# Patient Record
Sex: Female | Born: 1980 | Race: White | Hispanic: No | Marital: Married
Health system: Midwestern US, Community
[De-identification: ages and names within clinical notes are randomized; demographics above are authoritative.]

## PROBLEM LIST (undated history)

## (undated) DIAGNOSIS — E049 Nontoxic goiter, unspecified: Secondary | ICD-10-CM

## (undated) DIAGNOSIS — E041 Nontoxic single thyroid nodule: Secondary | ICD-10-CM

## (undated) DIAGNOSIS — M79662 Pain in left lower leg: Secondary | ICD-10-CM

## (undated) DIAGNOSIS — G43009 Migraine without aura, not intractable, without status migrainosus: Secondary | ICD-10-CM

## (undated) DIAGNOSIS — R519 Headache, unspecified: Secondary | ICD-10-CM

## (undated) DIAGNOSIS — R51 Headache: Secondary | ICD-10-CM

## (undated) DIAGNOSIS — E039 Hypothyroidism, unspecified: Secondary | ICD-10-CM

## (undated) HISTORY — PX: OVARIAN CYST REMOVAL: SHX89

## (undated) HISTORY — PX: MOUTH SURGERY: SHX715

---

## 2013-07-20 MED ORDER — VERAPAMIL 80 MG TAB
80 mg | ORAL_TABLET | Freq: Three times a day (TID) | ORAL | Status: DC
Start: 2013-07-20 — End: 2014-08-15

## 2013-07-20 MED ORDER — METHYLPREDNISOLONE 4 MG TABS IN A DOSE PACK
4 mg | PACK | ORAL | Status: DC
Start: 2013-07-20 — End: 2014-08-15

## 2013-07-20 NOTE — Progress Notes (Signed)
Neurology Consult      Subjective:      Whitney CoteCatherine H Johnston is a 33 y.o. 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 So she is had established headache since high school that sound like migraines. They would occur randomly and simply treat with over-the-counter remedies. Then about two years ago had an IUD inserted with intensification of headaches and occasional left eye discomfort times two months nocturnally. She had the IUD removed and they eventually stopped. This occurred for about two months. Now, in the last two months. In addition, to her baseline headaches. She has the appearance of these intense nocturnal headaches that awaken her in sleep. They are stereotypically in and around the left eye with associated tearing and she paces the floor and it can last anywhere from 30 min. To three hours. A hot shower on the face seems to help and does notice some nasal stuffiness on the L side. The left eye may appear injected and it does tear and the husband has commented on the eyelid appearing to be drooped. There is no nausea or vomiting. She also has a some generalized headaches these days with nausea and vomiting and dizziness that is enhanced by light, noise, smell and activity. Currently gets these nighttime headaches twice per week times two months. Has noticed that when she imbibes wine she experiences a hot facial feeling and may expect a nocturnal headache.          Current Outpatient Prescriptions   Medication Sig Dispense Refill   ??? ibuprofen (MOTRIN) 600 mg tablet Take  by mouth every six (6) hours as needed for Pain.       ??? ibuprofen (ADVIL MIGRAINE) 200 mg cap Take  by mouth.       ??? metFORMIN (GLUCOPHAGE) 500 mg tablet Take  by mouth two (2) times daily (with meals).       ??? verapamil (CALAN) 80 mg tablet Take 1 Tab by mouth three (3) times daily.  90 Tab  6   ??? methylPREDNISolone (MEDROL DOSEPACK) 4 mg tablet Per dose pack instructions  1 Package  1      No Known Allergies  Past  Medical History   Diagnosis Date   ??? Headache    ??? Elevated testosterone level in female    ??? Musculoskeletal disorder    ??? Anxiety       History reviewed. No pertinent past surgical history.   History     Social History   ??? Marital Status: N/A     Spouse Name: N/A     Number of Children: N/A   ??? Years of Education: N/A     Occupational History   ??? Not on file.     Social History Main Topics   ??? Smoking status: Former Smoker   ??? Smokeless tobacco: Never Used   ??? Alcohol Use: 2.0 oz/week     4 Glasses of wine per week   ??? Drug Use: No   ??? Sexual Activity: Yes     Other Topics Concern   ??? Not on file     Social History Narrative   ??? No narrative on file      Family History   Problem Relation Age of Onset   ??? Cancer Maternal Grandmother    ??? Stroke Maternal Grandmother    ??? Headache Paternal Grandmother    ??? Migraines Paternal Grandmother       BP 124/66  Pulse 62    Temp(Src) 98.1 ??F (36.7 ??C) (Oral)    Resp 20    Ht 5\' 6"  (1.676 m)    Wt 79.198 kg (174 lb 9.6 oz)    BMI 28.19 kg/m2      SpO2 98%    LMP 07/13/2013        Review of Systems:   A comprehensive review of systems was negative except for that written in the HPI.      Neuro Exam:     Appearance:  The patient is well developed, well nourished, provides a coherent history and is in no acute distress.   Mental Status: Oriented to time, place and person. Mood and affect appropriate.   Cranial Nerves:   Intact visual fields. Fundi are benign. PERLA, EOM's full, no nystagmus, no ptosis. Facial sensation is normal. Corneal reflexes are intact. Facial movement is symmetric. Hearing is normal bilaterally. Palate is midline with normal sternocleidomastoid and trapezius muscles are normal. Tongue is midline.   Motor:  5/5 strength in upper and lower proximal and distal muscles. Normal bulk and tone. No fasciculations.   Reflexes:   Deep tendon reflexes 2+/4 and symmetrical.   Sensory:   Normal to touch, pinprick and vibration.   Gait:  Normal gait.   Tremor:   No  tremor noted.   Cerebellar:  No cerebellar signs present.   Neurovascular:  Normal heart sounds and regular rhythm, peripheral pulses intact, and no carotid bruits.            Assessment:   Baseline migraine headaches by history and new type of L peri-orbital intense nocturnal pains. Would like to investigate new headache with imaging including brain MRI with without contrast and MRA. Would like to intervene with a Medrol dose pack and sumavel injectable as needed as well as verapamil 80 mg TID as a preventative. Was cautioned about alcohol as a trigger and could try melatonin at night before she sleeps. Will see her back shortly and see how it goes.      Plan:   revisit within two months.  Signed by :  Milana Na, MD 07/20/2013

## 2013-07-20 NOTE — Patient Instructions (Addendum)
If we have ordered testing for you, we do not call patients with results and we do not give test results over the phone.  We schedule follow up appointments so that your results can be discussed in person and any questions you have regarding them may be addressed.  If something of concern is revealed on your test, we will call you for a sooner follow up appointment.  Additionally, results may be found by using the My Chart feature and one of our patient service representatives at the front desk can give you instructions on how to access this feature of our electronic medical record system.    If you have physican order for a test or a medication denied by your insurance company, this does not mean the test or medication is not appropriate for you as that is a medical decision, not a decision to be made by an insurance company representative or by an [facility] physician who has not interviewed and examined you. This is a decision to be made between you and your physician.     The denial of services is a Education administrator between you and your insurance company, not an issue between your physician and the insurance company. If your test or medication is denied, you can take the following steps to help resolve the issue:    1.  File a complaint with the [employer] regarding your insurance company's denial of services ordered for you.  You can do this either by calling them directly or by completing an on-line complaint form on the [employer].  This can be found at [email or web address]    2.   Also file a formal complaint with your insurance company and ask to have the name of the person denying the service so that you may explore a legal option should you be harmed by this denial of service.  Again, the fact the insurance company will not pay for the service does not mean it is not medically necessary and I would encourage you to follow through with the plan that was made  with your physician    3.   File a written complaint with your employer so your employer and benefit manager is aware of the poor coverage they are providing their employees.  If you have medicare/medicaid, complain to your representative in the House and to your [place].                [facility]   Statement to Patients  July 20, 2012      In an effort to ensure the large volume of patient prescription refills is processed in the most efficient and expeditious manner, we are asking our patients to assist [place] by calling your Pharmacy for all prescription refills, this will include also your  Mail Order Pharmacy. The pharmacy will contact our office electronically to continue the refill process.    Please do not wait until the last minute to call your pharmacy. We need at least 48 hours (2days) to fill prescriptions. We also encourage you to call your pharmacy before going to pick up your prescription to make sure it is ready.     With regard to controlled substance prescription refill requests (narcotic refills) that need to be picked up at our office, we ask your cooperation by providing [place] with at least 72 hours (3days) notice that you will need a refill.  We will not refill narcotic prescription refill requests after 4:00pm on any weekday, Monday through Thursday, or after 2:00pm on Fridays, or on the weekends.      We encourage everyone to explore another way of getting your prescription refill request processed using MyChart, our patient web portal through our electronic medical record system. MyChart is an efficient and effective way to communicate your medication request directly to the office and  downloadable as an app on your smart phone . MyChart also features a review functionality that allows you to view your medication list as well as leave messages for your physician. Are you ready to get connected? If so please review the attatched instructions or  speak to any of our staff to get you set up right away!    Thank you so much for your cooperation. Should you have any questions please contact our Research officer, political party.    The Physicians and Staff,  [facility]                 Headache: After Your Visit  Your Care Instructions     Headaches have many possible causes. Most headaches aren't a sign of a more serious problem, and they will get better on their own. Home treatment may help you feel better faster.  The doctor has checked you carefully, but problems can develop later. If you notice any problems or new symptoms, get medical treatment right away.  Follow-up care is a key part of your treatment and safety. Be sure to make and go to all appointments, and call your doctor if you are having problems. It's also a good idea to know your test results and keep a list of the medicines you take.  How can you care for yourself at home?  ?? Do not drive if you have taken a prescription pain medicine.  ?? Rest in a quiet, dark room until your headache is gone. Close your eyes and try to relax or go to sleep. Don't watch TV or read.  ?? Put a cold, moist cloth or cold pack on the painful area for 10 to 20 minutes at a time. Put a thin cloth between the cold pack and your skin.  ?? Use a warm, moist towel or a heating pad set on low to relax tight shoulder and neck muscles.  ?? Have someone gently massage your neck and shoulders.  ?? Take pain medicines exactly as directed.  ?? If the doctor gave you a prescription medicine for pain, take it as prescribed.  ?? If you are not taking a prescription pain medicine, ask your doctor if you can take an over-the-counter medicine.  ?? Be careful not to take pain medicine more often than the instructions allow, because you may get worse or more frequent headaches when the medicine wears off.  ?? Do not ignore new symptoms that occur with a headache, such as a fever, weakness or numbness, vision changes, or confusion. These  may be signs of a more serious problem.  To prevent headaches  ?? Keep a headache diary so you can figure out what triggers your headaches. Avoiding triggers may help you prevent headaches. Record when each headache began, how long it lasted, and what the pain was like (throbbing, aching, stabbing, or dull). Write down any other symptoms you had with the headache, such as nausea, flashing lights or dark spots, or sensitivity to bright light or loud noise. Note if the headache occurred near your period.  List anything that might have triggered the headache, such as certain foods (chocolate, cheese, wine) or odors, smoke, bright light, stress, or lack of sleep.  ?? Find healthy ways to deal with stress. Headaches are most common during or right after stressful times. Take time to relax before and after you do something that has caused a headache in the past.  ?? Try to keep your muscles relaxed by keeping good posture. Check your jaw, face, neck, and shoulder muscles for tension, and try relaxing them. When sitting at a desk, change positions often, and stretch for 30 seconds each hour.  ?? Get plenty of sleep and exercise.  ?? Eat regularly and well. Long periods without food can trigger a headache.  ?? Treat yourself to a massage. Some people find that regular massages are very helpful in relieving tension.  ?? Limit caffeine by not drinking too much coffee, tea, or soda. But don't quit caffeine suddenly, because that can also give you headaches.  ?? Reduce eyestrain from computers by blinking frequently and looking away from the computer screen every so often. Make sure you have proper eyewear and that your monitor is set up properly, about an arm's length away.  ?? Seek help if you have depression or anxiety. Your headaches may be linked to these conditions. Treatment can both prevent headaches and help with symptoms of anxiety or depression.  When should you call for help?  Call 911 anytime you think you may need emergency  care. For example, call if:  ?? You have signs of a stroke. These may include:  ?? Sudden numbness, paralysis, or weakness in your face, arm, or leg, especially on only one side of your body.  ?? Sudden vision changes.  ?? Sudden trouble speaking.  ?? Sudden confusion or trouble understanding simple statements.  ?? Sudden problems with walking or balance.  ?? A sudden, severe headache that is different from past headaches.  Call your doctor now or seek immediate medical care if:  ?? You have a new or worse headache.  ?? Your headache gets much worse.   Where can you learn more?   Go to [email or web address].[email or web address].net/[email or web address]  Enter M271 in the search box to learn more about "Headache: After Your Visit."   ?? 2006-2015 Healthwise, Incorporated. Care instructions adapted under license by [employer] (which disclaims liability or warranty for this information). This care instruction is for use with your licensed healthcare professional. If you have questions about a medical condition or this instruction, always ask your healthcare professional. Healthwise, Incorporated disclaims any warranty or liability for your use of this information.  Content Version: 10.4.390249; Current as of: June 30, 2012  This patient has an interesting establish migraine history and recently a new intense left  Periorbital pain at night. Would like to investigate with dedicated brain MRI and MRA. Would like to rescue with a Medrol dose pack and use sumavel injectable and verapamil as a preventative. Can use melatonin at night before she sleeps to see if this helps. Would watch alcohol consumption as it may be intimately related to headache trigger.

## 2013-07-21 ENCOUNTER — Encounter

## 2013-07-21 MED ORDER — SUMAVEL DOSEPRO 6 MG/0.5 ML SUBCUTANEOUS NEEDLE-FREE INJECTOR
6 mg/0.5 mL | INJECTION | SUBCUTANEOUS | Status: DC
Start: 2013-07-21 — End: 2014-08-15

## 2013-07-21 NOTE — Telephone Encounter (Signed)
Noted, jjhmd

## 2013-07-21 NOTE — Telephone Encounter (Signed)
Pt calling stating Sumavel dospro was not sent to pharmacy listed. Please call pt when done.

## 2013-07-22 NOTE — Telephone Encounter (Signed)
Patient says she still has not gotten her sumavil. Please call.

## 2013-07-25 NOTE — Telephone Encounter (Signed)
Pt calling stating Sumavil dosepro was called into pharmacy but will call about $2,000 and would like to know if she can be called when office has samples. Please call to advise.

## 2013-11-23 ENCOUNTER — Encounter

## 2013-12-13 ENCOUNTER — Encounter

## 2014-06-15 ENCOUNTER — Inpatient Hospital Stay: Admit: 2014-06-15 | Payer: BLUE CROSS/BLUE SHIELD | Attending: Surgery | Primary: Internal Medicine

## 2014-06-15 DIAGNOSIS — E042 Nontoxic multinodular goiter: Secondary | ICD-10-CM

## 2014-08-15 ENCOUNTER — Inpatient Hospital Stay
Admit: 2014-08-15 | Discharge: 2014-08-15 | Disposition: A | Discharge status: Home / Self Care | Payer: BLUE CROSS/BLUE SHIELD | Attending: Emergency Medicine

## 2014-08-15 DIAGNOSIS — R1030 Lower abdominal pain, unspecified: Secondary | ICD-10-CM

## 2014-08-15 LAB — METABOLIC PANEL, COMPREHENSIVE
A-G Ratio: 1.1 (ref 1.1–2.2)
ALT (SGPT): 24 U/L (ref 12–78)
AST (SGOT): 12 U/L — ABNORMAL LOW (ref 15–37)
Albumin: 3.8 g/dL (ref 3.5–5.0)
Alk. phosphatase: 50 U/L (ref 45–117)
Anion gap: 6 mmol/L (ref 5–15)
BUN/Creatinine ratio: 11 — ABNORMAL LOW (ref 12–20)
BUN: 9 MG/DL (ref 6–20)
Bilirubin, total: 0.3 MG/DL (ref 0.2–1.0)
CO2: 29 mmol/L (ref 21–32)
Calcium: 8.7 MG/DL (ref 8.5–10.1)
Chloride: 106 mmol/L (ref 97–108)
Creatinine: 0.83 MG/DL (ref 0.55–1.02)
GFR est AA: >60 mL/min/{1.73_m2} (ref >60)
GFR est non-AA: >60 mL/min/{1.73_m2} (ref >60)
Globulin: 3.4 g/dL (ref 2.0–4.0)
Glucose: 69 mg/dL (ref 65–100)
Potassium: 3.7 mmol/L (ref 3.5–5.1)
Protein, total: 7.2 g/dL (ref 6.4–8.2)
Sodium: 141 mmol/L (ref 136–145)

## 2014-08-15 LAB — URINALYSIS W/ RFLX MICROSCOPIC
Bilirubin: NEGATIVE
Blood: NEGATIVE
Glucose: NEGATIVE mg/dL
Ketone: NEGATIVE mg/dL
Leukocyte Esterase: NEGATIVE
Nitrites: NEGATIVE
Protein: NEGATIVE mg/dL
Specific gravity: 1.008 (ref 1.003–1.030)
Urobilinogen: 0.2 EU/dL (ref 0.2–1.0)
pH (UA): 6 (ref 5.0–8.0)

## 2014-08-15 LAB — CBC WITH AUTOMATED DIFF
ABS. BASOPHILS: 0 10*3/uL (ref 0.0–0.1)
ABS. EOSINOPHILS: 0.1 10*3/uL (ref 0.0–0.4)
ABS. LYMPHOCYTES: 2.6 10*3/uL (ref 0.8–3.5)
ABS. MONOCYTES: 0.6 10*3/uL (ref 0.0–1.0)
ABS. NEUTROPHILS: 3.9 10*3/uL (ref 1.8–8.0)
BASOPHILS: 0 % (ref 0–1)
EOSINOPHILS: 2 % (ref 0–7)
HCT: 41.3 % (ref 35.0–47.0)
HGB: 14.4 g/dL (ref 11.5–16.0)
LYMPHOCYTES: 36 % (ref 12–49)
MCH: 32.9 PG (ref 26.0–34.0)
MCHC: 34.9 g/dL (ref 30.0–36.5)
MCV: 94.3 FL (ref 80.0–99.0)
MONOCYTES: 8 % (ref 5–13)
NEUTROPHILS: 54 % (ref 32–75)
PLATELET: 225 10*3/uL (ref 150–400)
RBC: 4.38 M/uL (ref 3.80–5.20)
RDW: 11.9 % (ref 11.5–14.5)
WBC: 7.2 10*3/uL (ref 3.6–11.0)

## 2014-08-15 LAB — HCG URINE, QL. - POC: Pregnancy test,urine (POC): NEGATIVE

## 2014-08-15 MED ORDER — IOPAMIDOL 76 % IV SOLN
370 mg iodine /mL (76 %) | Freq: Once | INTRAVENOUS | Status: AC
Start: 2014-08-15 — End: 2014-08-15
  Administered 2014-08-15: 14:00:00 via INTRAVENOUS

## 2014-08-15 MED ORDER — MORPHINE 4 MG/ML SYRINGE
4 mg/mL | Freq: Once | INTRAMUSCULAR | Status: AC
Start: 2014-08-15 — End: 2014-08-15
  Administered 2014-08-15: 13:00:00 via INTRAVENOUS

## 2014-08-15 MED ORDER — SODIUM CHLORIDE 0.9% BOLUS IV
0.9 % | Freq: Once | INTRAVENOUS | Status: DC
Start: 2014-08-15 — End: 2014-08-15

## 2014-08-15 MED ORDER — SODIUM CHLORIDE 0.9 % IJ SYRG
Freq: Once | INTRAMUSCULAR | Status: AC
Start: 2014-08-15 — End: 2014-08-15
  Administered 2014-08-15: 14:00:00 via INTRAVENOUS

## 2014-08-15 MED ORDER — ONDANSETRON (PF) 4 MG/2 ML INJECTION
4 mg/2 mL | INTRAMUSCULAR | Status: AC
Start: 2014-08-15 — End: 2014-08-15
  Administered 2014-08-15: 13:00:00 via INTRAVENOUS

## 2014-08-15 MED FILL — ISOVUE-370  76 % INTRAVENOUS SOLUTION: 370 mg iodine /mL (76 %) | INTRAVENOUS | Qty: 100

## 2014-08-15 MED FILL — BD POSIFLUSH NORMAL SALINE 0.9 % INJECTION SYRINGE: INTRAMUSCULAR | Qty: 10

## 2014-08-15 MED FILL — SODIUM CHLORIDE 0.9 % IV: INTRAVENOUS | Qty: 100

## 2014-08-15 MED FILL — MORPHINE 4 MG/ML SYRINGE: 4 mg/mL | INTRAMUSCULAR | Qty: 1

## 2014-08-15 MED FILL — ONDANSETRON (PF) 4 MG/2 ML INJECTION: 4 mg/2 mL | INTRAMUSCULAR | Qty: 2

## 2014-08-15 NOTE — ED Notes (Signed)
Patient reports taking a suppository 3 days ago with large amount of stool after.

## 2014-08-15 NOTE — Progress Notes (Signed)
PTA meds reviewed. Dicyclomine started yesterday.  I gave her 2 copies of her updated med list.

## 2014-08-15 NOTE — ED Notes (Signed)
Triage Note: "I'm having really bad bloating and abdominal cramps x4 days, it's not as swollen as it was last night, but I can't go through another night, I went to the doctor yesterday and they did blood work and said it would be 3-5 days, but I can't wait that long, I'm really hurting."  Patient reports nausea, no vomiting, +diarrhea.

## 2014-08-15 NOTE — ED Notes (Signed)
Pt resting in stretcher.  No needs expressed at this time

## 2014-08-15 NOTE — ED Notes (Signed)
Pt given discharge instructions and prescription, verbalized understanding.  Ambulated out of ER with steady gait

## 2014-08-15 NOTE — ED Provider Notes (Signed)
Patient is a 34 y.o. female presenting with abdominal pain.   Abdominal Pain      History of present illness: The patient reports intermittent abdominal cramping for over a month. He says she has long-standing slight to mild vaginal bleeding with exertion and after sexual intercourse. Gravida one para zero abortus one. Patient saw her obstetrician recently. She had an ultrasound. She reports that she may have a left ovarian cyst and a polyp. Patient saw her primary care physician yesterday. Patient says she might have irritable bowel syndrome and was started on a medication. Patient reports recent abdominal bloating. Patient took a laxative which she says cleaned her out. She complains of constant right greater than left lower quadrant pain radiating to the right flank and lumbar area. She denies dysuria and frequency. She has a slight chronic vaginal discharge. She continues to have slight vaginal bleeding with activities.      Past Medical History:   Diagnosis Date   ??? Headache    ??? Elevated testosterone level in female    ??? Musculoskeletal disorder    ??? Anxiety        History reviewed. No pertinent past surgical history.      Family History:   Problem Relation Age of Onset   ??? Cancer Maternal Grandmother    ??? Stroke Maternal Grandmother    ??? Headache Paternal Grandmother    ??? Migraines Paternal Grandmother        History     Social History   ??? Marital Status: MARRIED     Spouse Name: N/A   ??? Number of Children: N/A   ??? Years of Education: N/A     Occupational History   ??? Not on file.     Social History Main Topics   ??? Smoking status: Former Smoker   ??? Smokeless tobacco: Never Used   ??? Alcohol Use: 2.0 oz/week     4 Glasses of wine per week   ??? Drug Use: No   ??? Sexual Activity: Yes     Other Topics Concern   ??? Not on file     Social History Narrative           ALLERGIES: Review of patient's allergies indicates no known allergies.      Review of Systems   Gastrointestinal: Positive for abdominal pain.    All other systems reviewed and are negative.      Filed Vitals:    08/15/14 0824   BP: 116/73   Pulse: 64   Temp: 97.9 ??F (36.6 ??C)   Resp: 16   Height: 5\' 6"  (1.676 m)   Weight: 73.846 kg (162 lb 12.8 oz)   SpO2: 98%            Physical Exam   Constitutional: She appears well-developed and well-nourished.   HENT:   Head: Normocephalic and atraumatic.   Mouth/Throat: Oropharynx is clear and moist.   Eyes: EOM are normal. Pupils are equal, round, and reactive to light.   Neck: Normal range of motion. Neck supple.   Cardiovascular: Normal rate, regular rhythm, normal heart sounds and intact distal pulses.  Exam reveals no gallop and no friction rub.    No murmur heard.  Pulmonary/Chest: Effort normal. No respiratory distress. She has no wheezes. She has no rales.   Abdominal: Soft. There is tenderness. There is no rebound.   Mild LQ tenderness   Musculoskeletal: Normal range of motion. She exhibits no tenderness.   Neurological: She is  alert. No cranial nerve deficit.   Motor; symmetric   Skin: No erythema.   Psychiatric: She has a normal mood and affect. Her behavior is normal.   Nursing note and vitals reviewed.       MDM    Procedures      Note: Patient has recently been seen by her obstetrician and was seen by her primary care doctor yesterday. Workup today is negative. She is not pregnant. There is no evidence of a urinary tract infection. Appendix, colon, and small bowel are normal by CAT scan. Liver enzymes are normal. She does have some left ovarian cyst which she already knew about. Possibly she could have a slight leak in one of the cyst as the cause of her pain. Findings were just discussed with the patient and she voices understanding. She will followup with her primary care physician.  Raynald Kemp, MD  11:00 AM

## 2014-08-15 NOTE — Progress Notes (Signed)
CT waiting on labs  CT waiting on HCG results

## 2016-11-14 ENCOUNTER — Encounter

## 2016-11-14 ENCOUNTER — Inpatient Hospital Stay: Admit: 2016-11-14 | Payer: BLUE CROSS/BLUE SHIELD | Attending: Physician Assistant | Primary: Internal Medicine

## 2016-11-14 DIAGNOSIS — M79662 Pain in left lower leg: Secondary | ICD-10-CM

## 2018-04-21 NOTE — L&D Delivery Note (Signed)
Operative Delivery Note At 3:02 PM a viable female was delivered via Vaginal, Spontaneous.  Presentation: vertex; Position: Right,, Occiput,, Anterior; Station: +3. Nucal cord X1 reduced   Delivery of the head: 06/07/2018  3:01 PM First maneuver: 06/07/2018  3:01 PM, McRoberts Second maneuver: 06/07/2018  3:01 PM, Suprapubic Pressure Third maneuver: ,   Fourth maneuver: ,   Fifth maneuver: ,   Sixth maneuver: ,    Verbal consent: obtained from patient.  APGAR: 8, 9; weight 9 lb 4.9 oz (4220 g).   Placenta status: , .   Cord:  with the following complications: .  Cord pH: None  Anesthesia:  Epidural  Episiotomy: None Lacerations: 2nd degree Suture Repair: 3-0 vicryl  Est. Blood Loss (mL):  323 mL  Mom to postpartum.  Baby to Couplet care / Skin to Skin.  Gerald Leitz 06/07/2018, 5:41 PM

## 2018-05-05 LAB — OB RESULTS CONSOLE RPR: RPR: NONREACTIVE

## 2018-05-05 LAB — OB RESULTS CONSOLE GC/CHLAMYDIA
Chlamydia: NEGATIVE
Gonorrhea: NEGATIVE

## 2018-05-05 LAB — OB RESULTS CONSOLE HEPATITIS B SURFACE ANTIGEN: Hepatitis B Surface Ag: NEGATIVE

## 2018-05-05 LAB — OB RESULTS CONSOLE RUBELLA ANTIBODY, IGM: Rubella: IMMUNE

## 2018-05-05 LAB — OB RESULTS CONSOLE ANTIBODY SCREEN: Antibody Screen: NEGATIVE

## 2018-05-05 LAB — OB RESULTS CONSOLE ABO/RH: RH Type: POSITIVE

## 2018-05-05 LAB — OB RESULTS CONSOLE HIV ANTIBODY (ROUTINE TESTING): HIV: NONREACTIVE

## 2018-05-07 LAB — OB RESULTS CONSOLE GBS: GBS: NEGATIVE

## 2018-05-25 ENCOUNTER — Inpatient Hospital Stay (HOSPITAL_BASED_OUTPATIENT_CLINIC_OR_DEPARTMENT_OTHER): Payer: BLUE CROSS/BLUE SHIELD

## 2018-05-25 ENCOUNTER — Inpatient Hospital Stay (HOSPITAL_COMMUNITY)
Admission: AD | Admit: 2018-05-25 | Discharge: 2018-05-25 | Disposition: A | Discharge status: Home / Self Care | Payer: BLUE CROSS/BLUE SHIELD | Attending: Obstetrics & Gynecology | Admitting: Obstetrics & Gynecology

## 2018-05-25 ENCOUNTER — Encounter (HOSPITAL_COMMUNITY): Payer: Self-pay | Admitting: *Deleted

## 2018-05-25 ENCOUNTER — Other Ambulatory Visit: Payer: Self-pay

## 2018-05-25 DIAGNOSIS — O368130 Decreased fetal movements, third trimester, not applicable or unspecified: Secondary | ICD-10-CM | POA: Diagnosis present

## 2018-05-25 DIAGNOSIS — O368190 Decreased fetal movements, unspecified trimester, not applicable or unspecified: Secondary | ICD-10-CM | POA: Diagnosis not present

## 2018-05-25 DIAGNOSIS — Z3689 Encounter for other specified antenatal screening: Secondary | ICD-10-CM

## 2018-05-25 DIAGNOSIS — Z3A38 38 weeks gestation of pregnancy: Secondary | ICD-10-CM | POA: Diagnosis not present

## 2018-05-25 HISTORY — DX: Headache: R51

## 2018-05-25 HISTORY — DX: Headache, unspecified: R51.9

## 2018-05-25 MED ORDER — LACTATED RINGERS IV BOLUS
1000.0000 mL | Freq: Once | INTRAVENOUS | Status: AC
  Administered 2018-05-25: 1000 mL via INTRAVENOUS

## 2018-05-25 NOTE — Discharge Instructions (Signed)
Fetal Movement Counts Patient Name: ________________________________________________ Patient Due Date: ____________________ What is a fetal movement count?  A fetal movement count is the number of times that you feel your baby move during a certain amount of time. This may also be called a fetal kick count. A fetal movement count is recommended for every pregnant woman. You may be asked to start counting fetal movements as early as week 28 of your pregnancy. Pay attention to when your baby is most active. You may notice your baby's sleep and wake cycles. You may also notice things that make your baby move more. You should do a fetal movement count:  When your baby is normally most active.  At the same time each day. A good time to count movements is while you are resting, after having something to eat and drink. How do I count fetal movements? 1. Find a quiet, comfortable area. Sit, or lie down on your side. 2. Write down the date, the start time and stop time, and the number of movements that you felt between those two times. Take this information with you to your health care visits. 3. For 2 hours, count kicks, flutters, swishes, rolls, and jabs. You should feel at least 10 movements during 2 hours. 4. You may stop counting after you have felt 10 movements. 5. If you do not feel 10 movements in 2 hours, have something to eat and drink. Then, keep resting and counting for 1 hour. If you feel at least 4 movements during that hour, you may stop counting. Contact a health care provider if:  You feel fewer than 4 movements in 2 hours.  Your baby is not moving like he or she usually does. Date: ____________ Start time: ____________ Stop time: ____________ Movements: ____________ Date: ____________ Start time: ____________ Stop time: ____________ Movements: ____________ Date: ____________ Start time: ____________ Stop time: ____________ Movements: ____________ Date: ____________ Start time:  ____________ Stop time: ____________ Movements: ____________ Date: ____________ Start time: ____________ Stop time: ____________ Movements: ____________ Date: ____________ Start time: ____________ Stop time: ____________ Movements: ____________ Date: ____________ Start time: ____________ Stop time: ____________ Movements: ____________ Date: ____________ Start time: ____________ Stop time: ____________ Movements: ____________ Date: ____________ Start time: ____________ Stop time: ____________ Movements: ____________ This information is not intended to replace advice given to you by your health care provider. Make sure you discuss any questions you have with your health care provider. Document Released: 05/07/2006 Document Revised: 12/05/2015 Document Reviewed: 05/17/2015 Elsevier Interactive Patient Education  2019 [employer] Braxton Hicks Contractions Contractions of the uterus can occur throughout pregnancy, but they are not always a sign that you are in labor. You may have practice contractions called Braxton Hicks contractions. These false labor contractions are sometimes confused with true labor. What are Braxton Hicks contractions? Braxton Hicks contractions are tightening movements that occur in the muscles of the uterus before labor. Unlike true labor contractions, these contractions do not result in opening (dilation) and thinning of the cervix. Toward the end of pregnancy (32-34 weeks), Braxton Hicks contractions can happen more often and may become stronger. These contractions are sometimes difficult to tell apart from true labor because they can be very uncomfortable. You should not feel embarrassed if you go to the hospital with false labor. Sometimes, the only way to tell if you are in true labor is for your health care provider to look for changes in the cervix. The health care provider will do a physical exam and may monitor your contractions. If   you are not in true labor, the exam  should show that your cervix is not dilating and your water has not broken. If there are no other health problems associated with your pregnancy, it is completely safe for you to be sent home with false labor. You may continue to have Braxton Hicks contractions until you go into true labor. How to tell the difference between true labor and false labor True labor  Contractions last 30-70 seconds.  Contractions become very regular.  Discomfort is usually felt in the top of the uterus, and it spreads to the lower abdomen and low back.  Contractions do not go away with walking.  Contractions usually become more intense and increase in frequency.  The cervix dilates and gets thinner. False labor  Contractions are usually shorter and not as strong as true labor contractions.  Contractions are usually irregular.  Contractions are often felt in the front of the lower abdomen and in the groin.  Contractions may go away when you walk around or change positions while lying down.  Contractions get weaker and are shorter-lasting as time goes on.  The cervix usually does not dilate or become thin. Follow these instructions at home:   Take over-the-counter and prescription medicines only as told by your health care provider.  Keep up with your usual exercises and follow other instructions from your health care provider.  Eat and drink lightly if you think you are going into labor.  If Braxton Hicks contractions are making you uncomfortable: ? Change your position from lying down or resting to walking, or change from walking to resting. ? Sit and rest in a tub of warm water. ? Drink enough fluid to keep your urine pale yellow. Dehydration may cause these contractions. ? Do slow and deep breathing several times an hour.  Keep all follow-up prenatal visits as told by your health care provider. This is important. Contact a health care provider if:  You have a fever.  You have continuous  pain in your abdomen. Get help right away if:  Your contractions become stronger, more regular, and closer together.  You have fluid leaking or gushing from your vagina.  You pass blood-tinged mucus (bloody show).  You have bleeding from your vagina.  You have low back pain that you never had before.  You feel your baby's head pushing down and causing pelvic pressure.  Your baby is not moving inside you as much as it used to. Summary  Contractions that occur before labor are called Braxton Hicks contractions, false labor, or practice contractions.  Braxton Hicks contractions are usually shorter, weaker, farther apart, and less regular than true labor contractions. True labor contractions usually become progressively stronger and regular, and they become more frequent.  Manage discomfort from Braxton Hicks contractions by changing position, resting in a warm bath, drinking plenty of water, or practicing deep breathing. This information is not intended to replace advice given to you by your health care provider. Make sure you discuss any questions you have with your health care provider. Document Released: 08/21/2016 Document Revised: 01/20/2017 Document Reviewed: 08/21/2016 Elsevier Interactive Patient Education  2019 [employer] Inc[employer]  

## 2018-05-25 NOTE — MAU Provider Note (Addendum)
History     CSN: [id number]  Arrival date and time: 05/25/18 1235  Chief Complaint  Patient presents with  . Decreased Fetal Movement   G2P1001 @38 .0 wks presenting from office for decreased FM and ?low FHR. Report decreased FM yesterday. Feeling movement now. Denies VB, LOF, and ctx.  OB History    Gravida  2   Para  1   Term  1   Preterm      AB      Living  1     SAB      TAB      Ectopic      Multiple      Live Births  1           Past Medical History:  Diagnosis Date  . Headache     Past Surgical History:  Procedure Laterality Date  . OVARIAN CYST REMOVAL      History reviewed. No pertinent family history.  Social History   Tobacco Use  . Smoking status: Never Smoker  . Smokeless tobacco: Never Used  Substance Use Topics  . Alcohol use: Not on file  . Drug use: Not on file    Allergies: Allergies not on file  No medications prior to admission.    Review of Systems  Gastrointestinal: Negative for abdominal pain.  Genitourinary: Negative for vaginal bleeding.   Physical Exam   Blood pressure 126/76, pulse 79, temperature 98 F (36.7 C), resp. rate 16, last menstrual period 09/01/2017, SpO2 99 %.  Physical Exam  Constitutional: She is oriented to person, place, and time. She appears well-developed and well-nourished. No distress.  HENT:  Head: Normocephalic and atraumatic.  Neck: Normal range of motion.  Cardiovascular: Normal rate.  Respiratory: Effort normal. No respiratory distress.  Musculoskeletal: Normal range of motion.  Neurological: She is alert and oriented to person, place, and time.  Skin: Skin is warm and dry.  Psychiatric: She has a normal mood and affect.  EFM: 115 bpm, mod variability, + accels, no decels Toco: rare  MAU Course  Procedures Orders Placed This Encounter  Procedures  . [place] MFM FETAL BPP WO NON STRESS    Standing Status:   Standing    Number of Occurrences:   1    Order Specific Question:    What location should the exam be performed?    Answer:   [facility]    Order Specific Question:   Symptom/Reason for Exam    Answer:   Decreased fetal movement [id number]  . Discharge patient    Order Specific Question:   Discharge disposition    Answer:   01-Home or Self Care [1]    Order Specific Question:   Discharge patient date    Answer:   05/25/2018   MDM Chart review: pregnancy uncomplicated. BPP ordered. NST reactive, BPP 8/8, AFI 31, discussed with Dr. Charlotta Newton who will consult with Dr. Judeth Cornfield. 1420: Call from Dr. Charlotta Newton, after consult with Dr. Judeth Cornfield plan for 3 hours for prolonged EFM. Discussed plan with pt. 1450: Confirmed recommendation for prolonged EFM with Dr. Judeth Cornfield 1730: FHT continues to be reactive. Feeling good FM since arrival. Stable for discharge home.  Assessment and Plan   1. [redacted] weeks gestation of pregnancy   2. Decreased fetal movement   3. NST (non-stress test) reactive    Discharge home Follow up at [facility] in 2 days as scheduled [facility] Labor precautions  Allergies as of 05/25/2018   No Known Allergies  Medication List    TAKE these medications   prenatal multivitamin Tabs tablet Take 1 tablet by mouth daily at 12 noon.      Donette Larry, CNM 05/25/2018, 12:57 PM

## 2018-05-25 NOTE — MAU Note (Signed)
Pt sent from office for BPP and fetal monitoring

## 2018-05-31 ENCOUNTER — Encounter (HOSPITAL_COMMUNITY): Payer: Self-pay | Admitting: *Deleted

## 2018-05-31 ENCOUNTER — Telehealth (HOSPITAL_COMMUNITY): Payer: Self-pay | Admitting: *Deleted

## 2018-05-31 NOTE — Telephone Encounter (Signed)
Preadmission screen  

## 2018-06-01 ENCOUNTER — Encounter (HOSPITAL_COMMUNITY): Payer: Self-pay | Admitting: *Deleted

## 2018-06-02 ENCOUNTER — Inpatient Hospital Stay (HOSPITAL_COMMUNITY): Admission: RE | Admit: 2018-06-02 | Payer: BLUE CROSS/BLUE SHIELD | Source: Ambulatory Visit

## 2018-06-07 ENCOUNTER — Inpatient Hospital Stay (HOSPITAL_COMMUNITY): Payer: BLUE CROSS/BLUE SHIELD | Admitting: Anesthesiology

## 2018-06-07 ENCOUNTER — Encounter (HOSPITAL_COMMUNITY): Payer: Self-pay | Admitting: *Deleted

## 2018-06-07 ENCOUNTER — Inpatient Hospital Stay (HOSPITAL_COMMUNITY)
Admission: AD | Admit: 2018-06-07 | Discharge: 2018-06-08 | DRG: 807 | Disposition: A | Discharge status: Home / Self Care | Payer: BLUE CROSS/BLUE SHIELD | Attending: Obstetrics and Gynecology | Admitting: Obstetrics and Gynecology

## 2018-06-07 DIAGNOSIS — O3663X0 Maternal care for excessive fetal growth, third trimester, not applicable or unspecified: Secondary | ICD-10-CM | POA: Diagnosis present

## 2018-06-07 DIAGNOSIS — Z3483 Encounter for supervision of other normal pregnancy, third trimester: Secondary | ICD-10-CM | POA: Diagnosis present

## 2018-06-07 DIAGNOSIS — Z3A39 39 weeks gestation of pregnancy: Secondary | ICD-10-CM | POA: Diagnosis not present

## 2018-06-07 HISTORY — DX: Hypothyroidism, unspecified: E03.9

## 2018-06-07 LAB — CBC
HCT: 39.2 % (ref 36.0–46.0)
Hemoglobin: 13.5 g/dL (ref 12.0–15.0)
MCH: 32.6 pg (ref 26.0–34.0)
MCHC: 34.4 g/dL (ref 30.0–36.0)
MCV: 94.7 fL (ref 80.0–100.0)
Platelets: 219 10*3/uL (ref 150–400)
RBC: 4.14 MIL/uL (ref 3.87–5.11)
RDW: 12.8 % (ref 11.5–15.5)
WBC: 8.5 10*3/uL (ref 4.0–10.5)
nRBC: 0 % (ref 0.0–0.2)

## 2018-06-07 MED ORDER — METHYLERGONOVINE MALEATE 0.2 MG PO TABS: 0.2000 mg | ORAL_TABLET | ORAL | Status: DC | PRN

## 2018-06-07 MED ORDER — OXYCODONE-ACETAMINOPHEN 5-325 MG PO TABS: 2.0000 | ORAL_TABLET | ORAL | Status: DC | PRN

## 2018-06-07 MED ORDER — LACTATED RINGERS IV SOLN
INTRAVENOUS | Status: DC
  Administered 2018-06-07 (×2): via INTRAVENOUS

## 2018-06-07 MED ORDER — DIPHENHYDRAMINE HCL 25 MG PO CAPS: 25.0000 mg | ORAL_CAPSULE | Freq: Four times a day (QID) | ORAL | Status: DC | PRN

## 2018-06-07 MED ORDER — SIMETHICONE 80 MG PO CHEW: 80.0000 mg | CHEWABLE_TABLET | ORAL | Status: DC | PRN

## 2018-06-07 MED ORDER — BENZOCAINE-MENTHOL 20-0.5 % EX AERO
1.0000 "application " | INHALATION_SPRAY | CUTANEOUS | Status: DC | PRN
  Administered 2018-06-07: 1 via TOPICAL
  Filled 2018-06-07: qty 56

## 2018-06-07 MED ORDER — OXYCODONE HCL 5 MG PO TABS: 10.0000 mg | ORAL_TABLET | ORAL | Status: DC | PRN

## 2018-06-07 MED ORDER — ONDANSETRON HCL 4 MG/2ML IJ SOLN: 4.0000 mg | INTRAMUSCULAR | Status: DC | PRN

## 2018-06-07 MED ORDER — FENTANYL-BUPIVACAINE-NACL 0.5-0.125-0.9 MG/250ML-% EP SOLN: 14.0000 mL/h | EPIDURAL | Status: DC | PRN

## 2018-06-07 MED ORDER — DIBUCAINE 1 % RE OINT: 1.0000 "application " | TOPICAL_OINTMENT | RECTAL | Status: DC | PRN

## 2018-06-07 MED ORDER — FENTANYL 2.5 MCG/ML BUPIVACAINE 1/10 % EPIDURAL INFUSION (WH - ANES)
14.0000 mL/h | INTRAMUSCULAR | Status: DC | PRN
  Administered 2018-06-07: 14 mL/h via EPIDURAL
  Filled 2018-06-07: qty 100

## 2018-06-07 MED ORDER — ONDANSETRON HCL 4 MG PO TABS: 4.0000 mg | ORAL_TABLET | ORAL | Status: DC | PRN

## 2018-06-07 MED ORDER — IBUPROFEN 600 MG PO TABS
600.0000 mg | ORAL_TABLET | Freq: Four times a day (QID) | ORAL | Status: DC
  Administered 2018-06-07 – 2018-06-08 (×5): 600 mg via ORAL
  Filled 2018-06-07 (×5): qty 1

## 2018-06-07 MED ORDER — OXYTOCIN 40 UNITS IN NORMAL SALINE INFUSION - SIMPLE MED
2.5000 [IU]/h | INTRAVENOUS | Status: DC
  Filled 2018-06-07: qty 1000

## 2018-06-07 MED ORDER — ERYTHROMYCIN 5 MG/GM OP OINT
TOPICAL_OINTMENT | OPHTHALMIC | Status: AC
  Filled 2018-06-07: qty 1

## 2018-06-07 MED ORDER — LACTATED RINGERS IV SOLN: 500.0000 mL | INTRAVENOUS | Status: DC | PRN

## 2018-06-07 MED ORDER — OXYTOCIN BOLUS FROM INFUSION
500.0000 mL | Freq: Once | INTRAVENOUS | Status: AC
  Administered 2018-06-07: 500 mL via INTRAVENOUS

## 2018-06-07 MED ORDER — ACETAMINOPHEN 325 MG PO TABS: 650.0000 mg | ORAL_TABLET | ORAL | Status: DC | PRN

## 2018-06-07 MED ORDER — METHYLERGONOVINE MALEATE 0.2 MG/ML IJ SOLN: 0.2000 mg | INTRAMUSCULAR | Status: DC | PRN

## 2018-06-07 MED ORDER — PHENYLEPHRINE 40 MCG/ML (10ML) SYRINGE FOR IV PUSH (FOR BLOOD PRESSURE SUPPORT)
80.0000 ug | PREFILLED_SYRINGE | INTRAVENOUS | Status: DC | PRN
  Filled 2018-06-07: qty 10

## 2018-06-07 MED ORDER — LIDOCAINE HCL (PF) 1 % IJ SOLN
30.0000 mL | INTRAMUSCULAR | Status: DC | PRN
  Filled 2018-06-07: qty 30

## 2018-06-07 MED ORDER — PHENYLEPHRINE 40 MCG/ML (10ML) SYRINGE FOR IV PUSH (FOR BLOOD PRESSURE SUPPORT)
80.0000 ug | PREFILLED_SYRINGE | INTRAVENOUS | Status: DC | PRN
  Filled 2018-06-07 (×2): qty 10

## 2018-06-07 MED ORDER — DIPHENHYDRAMINE HCL 50 MG/ML IJ SOLN: 12.5000 mg | INTRAMUSCULAR | Status: DC | PRN

## 2018-06-07 MED ORDER — FENTANYL CITRATE (PF) 100 MCG/2ML IJ SOLN: 50.0000 ug | INTRAMUSCULAR | Status: DC | PRN

## 2018-06-07 MED ORDER — OXYCODONE-ACETAMINOPHEN 5-325 MG PO TABS: 1.0000 | ORAL_TABLET | ORAL | Status: DC | PRN

## 2018-06-07 MED ORDER — SOD CITRATE-CITRIC ACID 500-334 MG/5ML PO SOLN: 30.0000 mL | ORAL | Status: DC | PRN

## 2018-06-07 MED ORDER — LACTATED RINGERS IV SOLN: 500.0000 mL | Freq: Once | INTRAVENOUS | Status: DC

## 2018-06-07 MED ORDER — EPHEDRINE 5 MG/ML INJ
10.0000 mg | INTRAVENOUS | Status: DC | PRN
  Filled 2018-06-07: qty 2

## 2018-06-07 MED ORDER — SODIUM BICARBONATE 8.4 % IV SOLN
INTRAVENOUS | Status: DC | PRN
  Administered 2018-06-07: 3 mL via EPIDURAL
  Administered 2018-06-07: 5 mL via EPIDURAL
  Administered 2018-06-07: 2 mL via EPIDURAL

## 2018-06-07 MED ORDER — OXYCODONE HCL 5 MG PO TABS: 5.0000 mg | ORAL_TABLET | ORAL | Status: DC | PRN

## 2018-06-07 MED ORDER — PRENATAL MULTIVITAMIN CH
1.0000 | ORAL_TABLET | Freq: Every day | ORAL | Status: DC
  Filled 2018-06-07: qty 1

## 2018-06-07 MED ORDER — LACTATED RINGERS IV SOLN
500.0000 mL | Freq: Once | INTRAVENOUS | Status: AC
  Administered 2018-06-07: 500 mL via INTRAVENOUS

## 2018-06-07 MED ORDER — COCONUT OIL OIL: 1.0000 "application " | TOPICAL_OIL | Status: DC | PRN

## 2018-06-07 MED ORDER — SENNOSIDES-DOCUSATE SODIUM 8.6-50 MG PO TABS
2.0000 | ORAL_TABLET | ORAL | Status: DC
  Administered 2018-06-07: 2 via ORAL
  Filled 2018-06-07: qty 2

## 2018-06-07 MED ORDER — ZOLPIDEM TARTRATE 5 MG PO TABS: 5.0000 mg | ORAL_TABLET | Freq: Every evening | ORAL | Status: DC | PRN

## 2018-06-07 MED ORDER — ONDANSETRON HCL 4 MG/2ML IJ SOLN
4.0000 mg | Freq: Four times a day (QID) | INTRAMUSCULAR | Status: DC | PRN
  Administered 2018-06-07: 4 mg via INTRAVENOUS
  Filled 2018-06-07: qty 2

## 2018-06-07 MED ORDER — WITCH HAZEL-GLYCERIN EX PADS: 1.0000 "application " | MEDICATED_PAD | CUTANEOUS | Status: DC | PRN

## 2018-06-07 NOTE — MAU Note (Signed)
Pt states contractions started around 0700, denies bleeding or LOF.  Has lost part of mucus plug.  Reports fetal movement, pt states she has hx of  FHR decels & she has been observed closely for the last 2 weeks.

## 2018-06-07 NOTE — H&P (Signed)
Teresa Ferguson is a 38 y.o. female G3 P1011 at 39 wks and 6 days  presenting  In active labor. She reports contractions this AM every 2-4 minutes. She was 4 cm in MAU. +FM no lof no vaginal bleeding. She comfortable with an epidural. Prenatal care with Dr. Myna Hidalgo at [facility]. Pregnancy complicated by low fetal heart rate baseline and LGA fetus greater than 90%.. Last u/s 05/27/2018 8 lbs 4 oz . Pt reports shoulder dystocia with previous delivery fetus 7 lbs 13 oz.  . OB History    Gravida  3   Para  1   Term  1   Preterm      AB  1   Living  1     SAB      TAB  1   Ectopic      Multiple      Live Births  1          Past Medical History:  Diagnosis Date  . Headache   . Hypothyroidism    Past Surgical History:  Procedure Laterality Date  . MOUTH SURGERY    . OVARIAN CYST REMOVAL     Family History: family history includes Breast cancer in her maternal grandmother; Stroke in her paternal grandmother. Social History:  reports that she has never smoked. She has never used smokeless tobacco. She reports previous alcohol use. She reports that she does not use drugs.     Maternal Diabetes: No Genetic Screening: Declined Maternal Ultrasounds/Referrals: Normal Fetal Ultrasounds or other Referrals:  None Maternal Substance Abuse:  No Significant Maternal Medications:  None Significant Maternal Lab Results:  Lab values include: Group B Strep negative Other Comments:  None  Review of Systems  Constitutional: Negative.   HENT: Negative.   Eyes: Negative.   Respiratory: Negative.   Cardiovascular: Negative.   Gastrointestinal: Negative.   Genitourinary: Negative.   Musculoskeletal: Negative.   Skin: Negative.   Neurological: Negative.   Endo/Heme/Allergies: Negative.   Psychiatric/Behavioral: Negative.    Maternal Medical History:  Reason for admission: Contractions.   Contractions: Frequency: regular.   Perceived severity is strong.    Fetal  activity: Perceived fetal activity is normal.      Dilation: 10 Effacement (%): 100 Station: Plus 2 Exam by:: m wilkins rnc Blood pressure 122/72, pulse 64, temperature (!) 97.2 F (36.2 C), temperature source Axillary, resp. rate 19, height 5\' 5"  (1.651 m), weight 91.1 kg, last menstrual period 09/01/2017.   Fetal Exam Fetal Monitor Review: Baseline rate: 110.  Variability: moderate (6-25 bpm).   Pattern: accelerations present and no decelerations.    Fetal State Assessment: Category I - tracings are normal.     Physical Exam  Vitals reviewed. Constitutional: She is oriented to person, place, and time. She appears well-developed and well-nourished.  HENT:  Head: Normocephalic and atraumatic.  Eyes: Pupils are equal, round, and reactive to light. Conjunctivae are normal.  Neck: Normal range of motion. Neck supple.  Cardiovascular: Normal rate and regular rhythm.  Respiratory: Effort normal and breath sounds normal.  GI: There is no abdominal tenderness.  Genitourinary:    Vagina normal.   Musculoskeletal: Normal range of motion.        General: No edema.  Neurological: She is alert and oriented to person, place, and time.  Skin: Skin is warm and dry.  Psychiatric: She has a normal mood and affect.    Cervix 7/100/-1 AROM clear fluid  Prenatal labs: ABO, Rh: --/--/O POS (02/17  1009) Antibody: POS (02/17 1009) Rubella: Immune (01/15 0000) RPR: Nonreactive (01/15 0000)  HBsAg: Negative (01/15 0000)  HIV: Non-reactive (01/15 0000)  GBS: Negative (01/17 0000)   Assessment/Plan: 39 wks and 6 days in active labor  AROM performed clear fluid  Anticipate SVD    Gerald Leitz 06/07/2018, 5:34 PM

## 2018-06-07 NOTE — Anesthesia Procedure Notes (Signed)
Epidural Patient location during procedure: OB Start time: 06/07/2018 11:00 AM End time: 06/07/2018 11:06 AM  Staffing Anesthesiologist: Marcene Duos, MD Performed: anesthesiologist   Preanesthetic Checklist Completed: patient identified, site marked, surgical consent, pre-op evaluation, timeout performed, IV checked, risks and benefits discussed and monitors and equipment checked  Epidural Patient position: sitting Prep: site prepped and draped and DuraPrep Patient monitoring: continuous pulse ox and blood pressure Approach: midline Location: L3-L4 Injection technique: LOR air  Needle:  Needle type: Tuohy  Needle gauge: 17 G Needle length: 9 cm and 9 Needle insertion depth: 7 cm Catheter type: closed end flexible Catheter size: 19 Gauge Catheter at skin depth: 12 cm Test dose: negative  Assessment Events: blood not aspirated, injection not painful, no injection resistance, negative IV test and no paresthesia

## 2018-06-07 NOTE — Anesthesia Preprocedure Evaluation (Signed)
Anesthesia Evaluation  Patient identified by MRN, date of birth, ID band Patient awake    Reviewed: Allergy & Precautions, Patient's Chart, lab work & pertinent test results  Airway Mallampati: II  TM Distance: >3 FB     Dental   Pulmonary neg pulmonary ROS,    breath sounds clear to auscultation       Cardiovascular negative cardio ROS   Rhythm:Regular Rate:Normal     Neuro/Psych  Headaches,    GI/Hepatic negative GI ROS, Neg liver ROS,   Endo/Other  negative endocrine ROS  Renal/GU negative Renal ROS     Musculoskeletal   Abdominal   Peds  Hematology negative hematology ROS (+)   Anesthesia Other Findings   Reproductive/Obstetrics (+) Pregnancy                             Lab Results  Component Value Date   WBC 8.5 06/07/2018   HGB 13.5 06/07/2018   HCT 39.2 06/07/2018   MCV 94.7 06/07/2018   PLT 219 06/07/2018    Anesthesia Physical Anesthesia Plan  ASA: II  Anesthesia Plan: Epidural   Post-op Pain Management:    Induction:   PONV Risk Score and Plan: Treatment may vary due to age or medical condition  Airway Management Planned: Natural Airway  Additional Equipment:   Intra-op Plan:   Post-operative Plan:   Informed Consent: I have reviewed the patients History and Physical, chart, labs and discussed the procedure including the risks, benefits and alternatives for the proposed anesthesia with the patient or authorized representative who has indicated his/her understanding and acceptance.       Plan Discussed with:   Anesthesia Plan Comments:         Anesthesia Quick Evaluation

## 2018-06-07 NOTE — Lactation Note (Signed)
This note was copied from a baby's chart. Lactation Consultation Note  Patient Name: Teresa Ferguson [id number] Date: 06/07/2018 Reason for consult: Initial assessment;Term;Other (Comment);Maternal endocrine disorder(DAT (+)) Type of Endocrine Disorder?: Thyroid(Hx of hypothyroidism but stable TSH during the pregnancy)  5 hours old FT female who is being exclusively BF by his mother, she's a P2 and experienced BF. She was able to BF her first child for 20 months, she also BF her during the first trimester of this pregnancy. Mom has a Hx of hypothyroidism but has maintained stable TSH levels during this pregnancy. Baby is DAT (+) and both parents are aware of DAT status. Mom had two DEBP at home, her old Medela and the new Spectra she ordered in the mail through her insurance.  Baby was asleep when entering the room, offered assistance with latch but mom politely declined stating that baby already fed; they're doing STS. Asked mom to call for assistance when needed. She stated that BF is going well and that she can hear baby swallowing when he's at the breast. LC reviewed hand expression with mom and she was able to get big drops of colostrum out of right breast. LC also encouraged mom to start pumping due to baby's DAT status, offered to set up a DEBP but mom voiced that she'd like to wait until tomorrow, she may bring her Medela DEBP from home to start pumping. Discussed normal newborn behavior, cluster feeding, feeding cues and jaundice.   Feeding plan:  1. Encouraged mom to feed baby STS 8-12 times/24 hours or sooner if feeding cues are present 2. Hand expression and spoon feeding was also encouraged in the meantime until mom starts double pumping 3. She'll let her RN know to set up our hospital grade DEBP or bring her own Medela from home  BF brochure, BF resources and feeding diary were reviewed. Parents reported all questions and concerns were answered, they're both aware of LC services and  will call PRN.  Maternal Data Formula Feeding for Exclusion: No Has patient been taught Hand Expression?: Yes Does the patient have breastfeeding experience prior to this delivery?: Yes  Feeding Feeding Type: Breast Fed  LATCH Score Latch: Repeated attempts needed to sustain latch, nipple held in mouth throughout feeding, stimulation needed to elicit sucking reflex.  Audible Swallowing: A few with stimulation  Type of Nipple: Everted at rest and after stimulation  Comfort (Breast/Nipple): Soft / non-tender  Hold (Positioning): Assistance needed to correctly position infant at breast and maintain latch.  LATCH Score: 7  Interventions Interventions: Breast feeding basics reviewed;Hand express;Breast compression;Breast massage  Lactation Tools Discussed/Used WIC Program: No   Consult Status Consult Status: Follow-up Date: 06/08/18 Follow-up type: In-patient    Teresa Ferguson 06/07/2018, 8:59 PM

## 2018-06-07 NOTE — Anesthesia Pain Management Evaluation Note (Signed)
  CR[facility] Pain Management Visit Note  Patient: Teresa Ferguson, 38 y.o., female  "Hello I am a member of the anesthesia team at [facility]. We have an anesthesia team available at all times to provide care throughout the hospital, including epidural management and anesthesia for C-section. I don't know your plan for the delivery whether it a natural birth, water birth, IV sedation, nitrous supplementation, doula or epidural, but we want to meet your pain goals."   1.Was your pain managed to your expectations on prior hospitalizations?   Yes   2.What is your expectation for pain management during this hospitalization?     Epidural  3.How can we help you reach that goal? Maintain epidural until delivery of infant.  Record the patient's initial score and the patient's pain goal.   Pain: 2  Pain Goal: 2 [facility] wants you to be able to say your pain was always managed very well.  Kaede Clendenen 06/07/2018

## 2018-06-08 LAB — CBC
HCT: 33.3 % — ABNORMAL LOW (ref 36.0–46.0)
Hemoglobin: 11.2 g/dL — ABNORMAL LOW (ref 12.0–15.0)
MCH: 31.8 pg (ref 26.0–34.0)
MCHC: 33.6 g/dL (ref 30.0–36.0)
MCV: 94.6 fL (ref 80.0–100.0)
Platelets: 198 10*3/uL (ref 150–400)
RBC: 3.52 MIL/uL — ABNORMAL LOW (ref 3.87–5.11)
RDW: 12.8 % (ref 11.5–15.5)
WBC: 12.5 10*3/uL — ABNORMAL HIGH (ref 4.0–10.5)
nRBC: 0 % (ref 0.0–0.2)

## 2018-06-08 LAB — RPR: RPR Ser Ql: NONREACTIVE

## 2018-06-08 MED ORDER — POLYETHYLENE GLYCOL 3350 17 G PO PACK
17.0000 g | PACK | Freq: Every day | ORAL | Status: DC | PRN
  Filled 2018-06-08: qty 1

## 2018-06-08 MED ORDER — POLYETHYLENE GLYCOL 3350 17 G PO PACK: 17.0000 g | PACK | Freq: Every day | ORAL | Status: DC

## 2018-06-08 MED ORDER — IBUPROFEN 600 MG PO TABS: 600.0000 mg | ORAL_TABLET | Freq: Four times a day (QID) | ORAL | 0 refills | Status: AC

## 2018-06-08 NOTE — Discharge Instructions (Signed)
Vaginal Delivery, Care After °Refer to this sheet in the next few weeks. These instructions provide you with information about caring for yourself after vaginal delivery. Your health care provider may also give you more specific instructions. Your treatment has been planned according to current medical practices, but problems sometimes occur. Call your health care provider if you have any problems or questions. °What can I expect after the procedure? °After vaginal delivery, it is common to have: °· Some bleeding from your vagina. °· Soreness in your abdomen, your vagina, and the area of skin between your vaginal opening and your anus (perineum). °· Pelvic cramps. °· Fatigue. °Follow these instructions at home: °Medicines °· Take over-the-counter and prescription medicines only as told by your health care provider. °· If you were prescribed an antibiotic medicine, take it as told by your health care provider. Do not stop taking the antibiotic until it is finished. °Driving ° °· Do not drive or operate heavy machinery while taking prescription pain medicine. °· Do not drive for 24 hours if you received a sedative. °Lifestyle °· Do not drink alcohol. This is especially important if you are breastfeeding or taking medicine to relieve pain. °· Do not use tobacco products, including cigarettes, chewing tobacco, or e-cigarettes. If you need help quitting, ask your health care provider. °Eating and drinking °· Drink at least 8 eight-ounce glasses of water every day unless you are told not to by your health care provider. If you choose to breastfeed your baby, you may need to drink more water than this. °· Eat high-fiber foods every day. These foods may help prevent or relieve constipation. High-fiber foods include: °? Whole grain cereals and breads. °? Brown rice. °? Beans. °? Fresh fruits and vegetables. °Activity °· Return to your normal activities as told by your health care provider. Ask your health care provider what  activities are safe for you. °· Rest as much as possible. Try to rest or take a nap when your baby is sleeping. °· Do not lift anything that is heavier than your baby or 10 lb (4.5 kg) until your health care provider says that it is safe. °· Talk with your health care provider about when you can engage in sexual activity. This may depend on your: °? Risk of infection. °? Rate of healing. °? Comfort and desire to engage in sexual activity. °Vaginal Care °· If you have an episiotomy or a vaginal tear, check the area every day for signs of infection. Check for: °? More redness, swelling, or pain. °? More fluid or blood. °? Warmth. °? Pus or a bad smell. °· Do not use tampons or douches until your health care provider says this is safe. °· Watch for any blood clots that may pass from your vagina. These may look like clumps of dark red, brown, or black discharge. °General instructions °· Keep your perineum clean and dry as told by your health care provider. °· Wear loose, comfortable clothing. °· Wipe from front to back when you use the toilet. °· Ask your health care provider if you can shower or take a bath. If you had an episiotomy or a perineal tear during labor and delivery, your health care provider may tell you not to take baths for a certain length of time. °· Wear a bra that supports your breasts and fits you well. °· If possible, have someone help you with household activities and help care for your baby for at least a few days after you   leave the hospital. °· Keep all follow-up visits for you and your baby as told by your health care provider. This is important. °Contact a health care provider if: °· You have: °? Vaginal discharge that has a bad smell. °? Difficulty urinating. °? Pain when urinating. °? A sudden increase or decrease in the frequency of your bowel movements. °? More redness, swelling, or pain around your episiotomy or vaginal tear. °? More fluid or blood coming from your episiotomy or vaginal  tear. °? Pus or a bad smell coming from your episiotomy or vaginal tear. °? A fever. °? A rash. °? Little or no interest in activities you used to enjoy. °? Questions about caring for yourself or your baby. °· Your episiotomy or vaginal tear feels warm to the touch. °· Your episiotomy or vaginal tear is separating or does not appear to be healing. °· Your breasts are painful, hard, or turn red. °· You feel unusually sad or worried. °· You feel nauseous or you vomit. °· You pass large blood clots from your vagina. If you pass a blood clot from your vagina, save it to show to your health care provider. Do not flush blood clots down the toilet without having your health care provider look at them. °· You urinate more than usual. °· You are dizzy or light-headed. °· You have not breastfed at all and you have not had a menstrual period for 12 weeks after delivery. °· You have stopped breastfeeding and you have not had a menstrual period for 12 weeks after you stopped breastfeeding. °Get help right away if: °· You have: °? Pain that does not go away or does not get better with medicine. °? Chest pain. °? Difficulty breathing. °? Blurred vision or spots in your vision. °? Thoughts about hurting yourself or your baby. °· You develop pain in your abdomen or in one of your legs. °· You develop a severe headache. °· You faint. °· You bleed from your vagina so much that you fill two sanitary pads in one hour. °This information is not intended to replace advice given to you by your health care provider. Make sure you discuss any questions you have with your health care provider. °Document Released: 04/04/2000 Document Revised: 09/19/2015 Document Reviewed: 04/22/2015 °Elsevier Interactive Patient Education © 2019 [employer] ° °

## 2018-06-08 NOTE — Progress Notes (Signed)
Postpartum Note Day # 1  S:  Patient resting comfortable in bed.  Pain controlled.  Tolerating general diet. No flatus, no BM.  Lochia moderate.  Ambulating without difficulty.  She denies n/v/f/c, SOB, or CP.  Pt plans on breastfeeding.  O: Temp:  [97.2 F (36.2 C)-98.7 F (37.1 C)] 97.8 F (36.6 C) (02/18 0600) Pulse Rate:  [64-111] 65 (02/18 0600) Resp:  [16-19] 18 (02/18 0600) BP: (96-126)/(48-85) 114/68 (02/18 0600) Weight:  [91.1 kg-91.2 kg] 91.1 kg (02/17 1009)   Gen: A&Ox3, NAD CV: RRR, no MRG Resp: CTAB Abdomen: soft, NT, ND Uterus: firm, non-tender, below umbilicus Ext: No edema, no calf tenderness bilaterally, SCDs in place  Labs:  Recent Labs    06/07/18 1006 06/08/18 0525  HGB 13.5 11.2*    A/P: Pt is a 38 y.o. [id number] s/p NSVD, PPD#1  - Pain well controlled -GU: voiding freely -GI: Tolerating general diet -Activity: encouraged sitting up to chair and ambulation as tolerated -Prophylaxis: early ambulation -Labs: stable as above  DISPO: Continue routine postpartum care. Possible early discharge, pending baby's discharge  Myna Hidalgo, DO [phone] (cell) [phone] (office)

## 2018-06-08 NOTE — Discharge Summary (Signed)
OB Discharge Summary     Patient Name: Teresa Ferguson DOB: May 30, 1980 MRN: [id number]  Date of admission: 06/07/2018 Delivering MD: Gerald Leitz   Date of discharge: 06/08/2018  Admitting diagnosis: 39 WKS CTX Intrauterine pregnancy: [redacted]w[redacted]d     Secondary diagnosis:  Active Problems:   Normal labor  Additional problems: Shoulder dystocia     Discharge diagnosis: Term Pregnancy Delivered                                                                                                Post partum procedures:none  Augmentation: AROM and none  Complications: None  Hospital course:  Onset of Labor With Vaginal Delivery     38 y.o. yo G3P1011 at [redacted]w[redacted]d was admitted in Latent Labor on 06/07/2018. Patient had an uncomplicated labor course as follows:  Membrane Rupture Time/Date: 1:41 PM ,06/07/2018   Intrapartum Procedures: Episiotomy: None [1]                                         Lacerations:  2nd degree [3]  Patient had a delivery of a Viable infant. 06/07/2018  Information for the patient's newborn:  Necola, Troll [[id number]]  Delivery Method: Vaginal, Spontaneous(Filed from Delivery Summary)    Pateint had an uncomplicated postpartum course.  She is ambulating, tolerating a regular diet, passing flatus, and urinating well. Patient is discharged home in stable condition on 06/08/18.   Physical exam  Vitals:   06/08/18 0230 06/08/18 0600 06/08/18 1409 06/08/18 1411  BP: 113/78 114/68 99/64 96/63   Pulse: 66 65 82 83  Resp: 16 18 17    Temp: 97.7 F (36.5 C) 97.8 F (36.6 C) 97.9 F (36.6 C)   TempSrc: Oral Oral Oral   SpO2:   98% 98%  Weight:      Height:      Gen: A&Ox3, NAD CV: RRR, no MRG Resp: CTAB Abdomen: soft, NT, ND Uterus: firm, non-tender, below umbilicus Ext: No edema, no calf tenderness bilaterally, SCDs in place  Labs: Lab Results  Component Value Date   WBC 12.5 (H) 06/08/2018   HGB 11.2 (L) 06/08/2018   HCT 33.3 (L) 06/08/2018   MCV  94.6 06/08/2018   PLT 198 06/08/2018   No flowsheet data found.  Discharge instruction: per After Visit Summary and "Baby and Me Booklet".  After visit meds:  Allergies as of 06/08/2018   No Known Allergies     Medication List    TAKE these medications   ibuprofen 600 MG tablet Commonly known as:  ADVIL,MOTRIN Take 1 tablet (600 mg total) by mouth every 6 (six) hours.   prenatal multivitamin Tabs tablet Take 1 tablet by mouth daily at 12 noon.       Diet: routine diet  Activity: Advance as tolerated. Pelvic rest for 6 weeks.   Outpatient follow up:6 weeks Follow up Appt:No future appointments. Follow up Visit:No follow-ups on file.  Postpartum contraception: IUD Paragard considering  Newborn Data: Live born female  Birth Weight:  9 lb 4.9 oz (4220 g) APGAR: 8, 9  Newborn Delivery   Time head delivered:  06/07/2018 15:01:00 Birth date/time:  06/07/2018 15:02:00 Delivery type:  Vaginal, Spontaneous     Baby Feeding: Breast Disposition:rooming in   06/08/2018 Sharon Seller, DO

## 2018-06-08 NOTE — Anesthesia Postprocedure Evaluation (Signed)
Anesthesia Post Note  Patient: Teresa Ferguson  Procedure(s) Performed: AN AD HOC LABOR EPIDURAL     Patient location during evaluation: Mother Baby Anesthesia Type: Epidural Level of consciousness: awake and alert Pain management: pain level controlled Vital Signs Assessment: post-procedure vital signs reviewed and stable Respiratory status: spontaneous breathing Cardiovascular status: stable Postop Assessment: no headache, patient able to bend at knees, no backache, no apparent nausea or vomiting, epidural receding, adequate PO intake and able to ambulate Anesthetic complications: no    Last Vitals:  Vitals:   06/08/18 0230 06/08/18 0600  BP: 113/78 114/68  Pulse: 66 65  Resp: 16 18  Temp: 36.5 C 36.6 C    Last Pain:  Vitals:   06/08/18 0600  TempSrc: Oral  PainSc:    Pain Goal: Patients Stated Pain Goal: 0 (06/07/18 0920)                 Salome Arnt

## 2018-06-08 NOTE — Lactation Note (Signed)
This note was copied from a baby's chart. Lactation Consultation Note: Experienced BF mom P2  Mom reports baby has just finished nursing for 40 min- is asleep in mom's arms. Reports he is latching well but nipples are a little tender. They look intact. Encouraged to rub EBM into nipples after nursing. Reports breasts are feeling slightly fuller this morning.  Reports they will recheck bili at 3 this afternoon- would like to go home today. Has pumped a couple of times. Encouraged to take a nap while baby is sleeping. No questions at present. Reviewed our phone number, OP appointments and BFGS as resources for support after DC. To call prn  Patient Name: Teresa Ferguson [id number] Date: 06/08/2018 Reason for consult: Follow-up assessment   Maternal Data Formula Feeding for Exclusion: No Has patient been taught Hand Expression?: Yes Does the patient have breastfeeding experience prior to this delivery?: Yes  Feeding Feeding Type: Breast Fed  LATCH Score                   Interventions    Lactation Tools Discussed/Used     Consult Status Consult Status: Follow-up Date: 06/09/18 Follow-up type: In-patient    Pamelia Hoit 06/08/2018, 10:15 AM

## 2018-06-10 ENCOUNTER — Inpatient Hospital Stay (HOSPITAL_COMMUNITY): Admission: RE | Admit: 2018-06-10 | Payer: BLUE CROSS/BLUE SHIELD | Source: Ambulatory Visit

## 2018-06-10 ENCOUNTER — Ambulatory Visit: Payer: Self-pay

## 2018-06-10 NOTE — Lactation Note (Signed)
This note was copied from a baby's chart. Lactation Consultation Note  Patient Name: Teresa Ferguson [id number] Date: 06/10/2018 Reason for consult: Follow-up assessment;Term Baby is 67 hours old/6% weight loss.  Phototherapy was discontinued and bili up to 11.1 this morning.  Baby will have a repeat bili at 1300.  Mom states baby is feeding well and breasts are full.  She does have a breast pump and knows she can post pump for comfort if needed.  No questions or concerns.  Mom is using good breast massage/compression during feeding.  Lactation outpatient services and support information reviewed and encouraged prn.  Maternal Data    Feeding    LATCH Score                   Interventions    Lactation Tools Discussed/Used     Consult Status Consult Status: Complete Follow-up type: Call as needed    Huston Foley 06/10/2018, 10:39 AM

## 2018-06-11 LAB — BPAM RBC
Blood Product Expiration Date: 202003212359
Blood Product Expiration Date: 202003222359
Unit Type and Rh: 9500
Unit Type and Rh: 9500

## 2018-06-11 LAB — TYPE AND SCREEN
ABO/RH(D): O POS
Antibody Screen: POSITIVE
Donor AG Type: NEGATIVE
Donor AG Type: NEGATIVE
PT AG Type: NEGATIVE
Unit division: 0
Unit division: 0

## 2018-08-31 ENCOUNTER — Encounter (HOSPITAL_COMMUNITY): Payer: Self-pay

## 2019-05-02 ENCOUNTER — Other Ambulatory Visit: Payer: Self-pay | Admitting: Obstetrics & Gynecology

## 2019-05-02 DIAGNOSIS — N644 Mastodynia: Secondary | ICD-10-CM

## 2019-05-05 ENCOUNTER — Ambulatory Visit
Admission: RE | Admit: 2019-05-05 | Discharge: 2019-05-05 | Disposition: A | Discharge status: Home / Self Care | Payer: BLUE CROSS/BLUE SHIELD | Source: Ambulatory Visit | Attending: Obstetrics & Gynecology | Admitting: Obstetrics & Gynecology

## 2019-05-05 ENCOUNTER — Other Ambulatory Visit: Payer: Self-pay

## 2019-05-05 ENCOUNTER — Ambulatory Visit
Admission: RE | Admit: 2019-05-05 | Discharge: 2019-05-05 | Disposition: A | Discharge status: Home / Self Care | Payer: BC Managed Care – PPO | Source: Ambulatory Visit | Attending: Obstetrics & Gynecology | Admitting: Obstetrics & Gynecology

## 2019-05-05 DIAGNOSIS — N644 Mastodynia: Secondary | ICD-10-CM

## 2019-05-19 ENCOUNTER — Other Ambulatory Visit: Payer: Self-pay | Admitting: Certified Nurse Midwife

## 2019-05-19 DIAGNOSIS — E041 Nontoxic single thyroid nodule: Secondary | ICD-10-CM

## 2019-05-27 ENCOUNTER — Other Ambulatory Visit: Payer: BC Managed Care – PPO

## 2019-06-03 ENCOUNTER — Ambulatory Visit: Payer: BC Managed Care – PPO | Admitting: Internal Medicine

## 2019-06-06 ENCOUNTER — Ambulatory Visit
Admission: RE | Admit: 2019-06-06 | Discharge: 2019-06-06 | Disposition: A | Discharge status: Home / Self Care | Payer: BC Managed Care – PPO | Source: Ambulatory Visit | Attending: Certified Nurse Midwife | Admitting: Certified Nurse Midwife

## 2019-06-06 DIAGNOSIS — E041 Nontoxic single thyroid nodule: Secondary | ICD-10-CM

## 2019-06-15 ENCOUNTER — Other Ambulatory Visit: Payer: Self-pay

## 2019-06-17 ENCOUNTER — Other Ambulatory Visit: Payer: Self-pay

## 2019-06-17 ENCOUNTER — Encounter: Payer: Self-pay | Admitting: Internal Medicine

## 2019-06-17 ENCOUNTER — Ambulatory Visit (INDEPENDENT_AMBULATORY_CARE_PROVIDER_SITE_OTHER): Payer: BC Managed Care – PPO | Admitting: Internal Medicine

## 2019-06-17 VITALS — BP 104/62 | HR 63 | Temp 97.7°F | Ht 66.0 in | Wt 161.2 lb

## 2019-06-17 DIAGNOSIS — E042 Nontoxic multinodular goiter: Secondary | ICD-10-CM | POA: Insufficient documentation

## 2019-06-17 NOTE — Progress Notes (Signed)
Name: Teresa Ferguson  MRN/ DOB: [id number], [birth year]    Age/ Sex: 39 y.o., female    PCP: Patient, No Pcp Per   Reason for Endocrinology Evaluation: MNG     Date of Initial Endocrinology Evaluation: 06/17/2019     HPI: Ms. Teresa Ferguson is a 39 y.o. female with a past medical history of MNG. The patient presented for initial endocrinology clinic visit on 06/17/2019 for consultative assistance with her MNG.   Pt has relocated from [place], [place]  She has been diagnosed with MNG years ago. She has been having serial imaging.   She has been diagnosed with hypothyroidism in 2014, she was on levothyroxine through her 1st pregnancy, but in 2018 she did not require LT-4 replacement.    Denies recent neck enlargement, has occasional shooting pain on the left.  She denies dysphagia or sob.    Strong FH of thyroid disorder ( mother, aunt, and     HISTORY:  Past Medical History:  Past Medical History:  Diagnosis Date  . Headache   . Hypothyroidism    Past Surgical History:  Past Surgical History:  Procedure Laterality Date  . MOUTH SURGERY    . OVARIAN CYST REMOVAL        Social History:  reports that she has never smoked. She has never used smokeless tobacco. She reports previous alcohol use. She reports that she does not use drugs.  Family History: family history includes Breast cancer in her maternal grandmother; Stroke in her paternal grandmother.   HOME MEDICATIONS: Allergies as of 06/17/2019   No Known Allergies     Medication List       Accurate as of June 17, 2019  3:33 PM. If you have any questions, ask your nurse or doctor.        cyanocobalamin 1000 MCG tablet Take 1,000 mcg by mouth daily.   ibuprofen 600 MG tablet Commonly known as: ADVIL Take 1 tablet (600 mg total) by mouth every 6 (six) hours.   prenatal multivitamin Tabs tablet Take 1 tablet by mouth daily at 12 noon.         REVIEW OF SYSTEMS: A comprehensive ROS was  conducted with the patient and is negative     OBJECTIVE:  VS: BP 104/62 (BP Location: Left Arm, Patient Position: Sitting, Cuff Size: Large)   Pulse 63   Temp 97.7 F (36.5 C)   Ht 5\' 6"  (1.676 m)   Wt 161 lb 3.2 oz (73.1 kg)   SpO2 98%   BMI 26.02 kg/m    Wt Readings from Last 3 Encounters:  06/17/19 161 lb 3.2 oz (73.1 kg)  06/07/18 200 lb 13.4 oz (91.1 kg)     EXAM: General: Pt appears well and is in NAD  Neck: General: Supple without adenopathy. Thyroid: Thyroid size normal.  No nodules appreciated on today's exam .   Lungs: Clear with good BS bilat with no rales, rhonchi, or wheezes  Heart: Auscultation: RRR.  Abdomen: Normoactive bowel sounds, soft, nontender, without masses or organomegaly palpable  Extremities:  BL LE: No pretibial edema normal ROM and strength.  Skin: Hair: Texture and amount normal with gender appropriate distribution Skin Inspection: No rashes Skin Palpation: Skin temperature, texture, and thickness normal to palpation  Neuro: Cranial nerves: II - XII grossly intact  Motor: Normal strength throughout DTRs: 2+ and symmetric in UE without delay in relaxation phase  Mental Status: Judgment, insight: Intact Orientation: Oriented to time, place, and person Mood and  affect: No depression, anxiety, or agitation     DATA REVIEWED:  05/11/2019  Prolactin 13.30 ng/mL  TSH 0.43 uIU/mL  FT4 1.55 ng/dL  Anti-TPO 14 U/mL ( < 5-03)    Thyroid Ultrasound 06/06/2019  Nodule # 2:  Location: Right; Mid  Maximum size: 1.3 cm; Other 2 dimensions: 1.1 x 0.8 cm  Composition: solid/almost completely solid (2)  Echogenicity: hypoechoic (2)  Shape: not taller-than-wide (0)  Margins: smooth (0)  Echogenic foci: none (0)  ACR TI-RADS total points: 4.  ACR TI-RADS risk category: TR4 (4-6 points).  ACR TI-RADS recommendations:  *Given size (>/= 1 - 1.4 cm) and appearance, a follow-up ultrasound in 1 year should be considered based on  TI-RADS criteria.  _________________________________________________________  There is an additional 0.5 cm nodule in the right mid thyroid gland as well as a 0.8 cm nodule in the superior left thyroid gland, neither of which meet criteria for follow-up or FNA.  There are no pathologically enlarged lymph nodes.  IMPRESSION: 1. Multinodular goiter as detailed above. 2. There is a 1.3 cm TR 4 thyroid nodule in the right mid thyroid gland that meets criteria for 1 year follow-up ultrasound.    ASSESSMENT/PLAN/RECOMMENDATIONS:   1. Multinodular Goiter:   - Pt is clinically and bio chemically euthyroid  - We reviewed her ultrasound results, no intervention is needed at this time. Will repeat US in 1 yr.  - We discussed that 95-97% of thyroid nodules are benign and with the chronicity of her nodules this is most likely consistent with benign process.    F/U in 1 yr  Signed electronically by: Lyndle Herrlich, MD  [facility] Endocrinology  [facility] [address] [place], [place] [address] Phone: [phone] FAX: [phone]   CC: Patient, No Pcp Per No address on file Phone: None Fax: None   Return to Endocrinology clinic as below: Future Appointments  Date Time Provider Department Center  06/18/2020 10:10 AM Ronnisha Felber, Konrad Dolores, MD [id number] None

## 2019-10-17 IMAGING — US US MFM FETAL BPP W/O NON-STRESS
1 series · 16 of 28 positions shown · non-contrast
Comparison: none

[Series 1: us mfm fetal bpp w/o non-stress · 39 acquisitions, 16 frames shown]
[im 1/39]
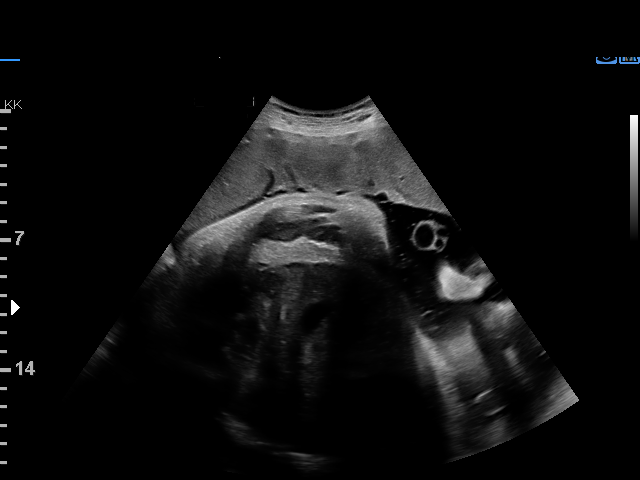
[im 3/39]
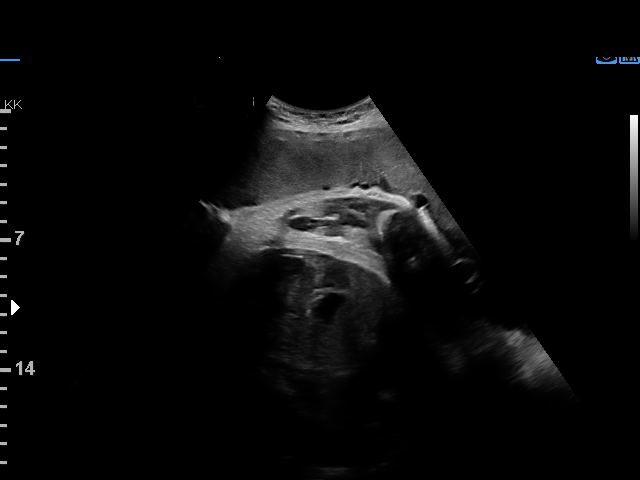
[im 6/39]
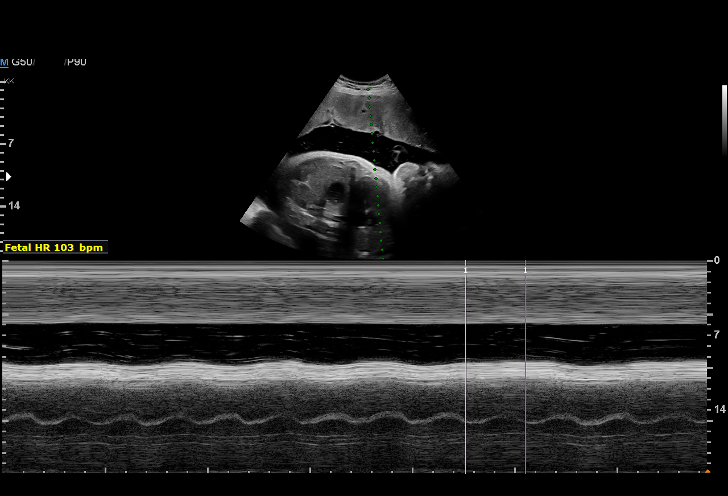
[im 9/39]
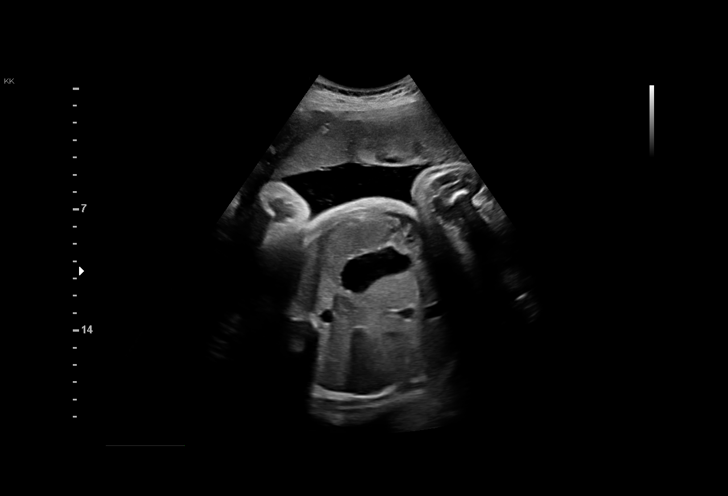
[im 10/39]
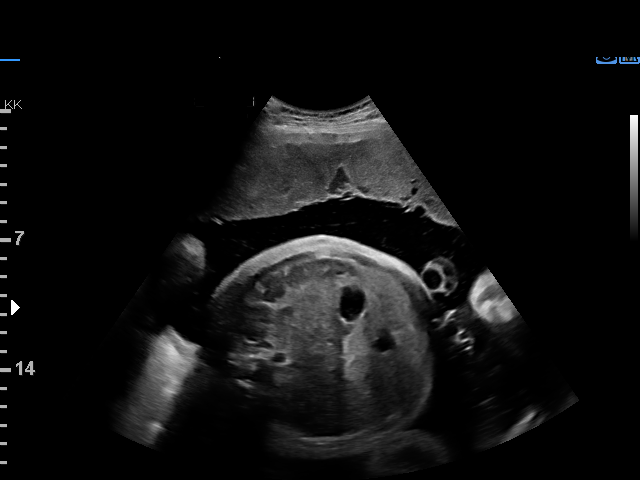
[im 13/39]
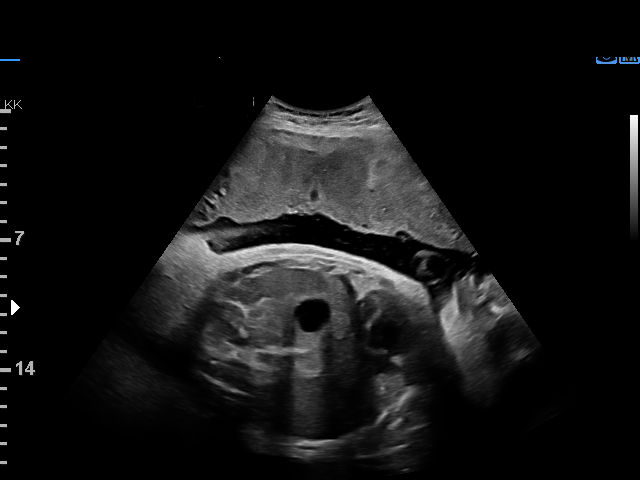
[im 16/39]
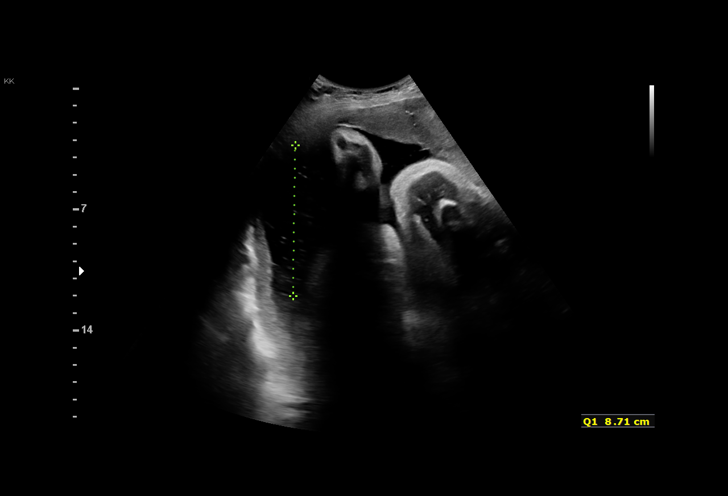
[im 19/39]
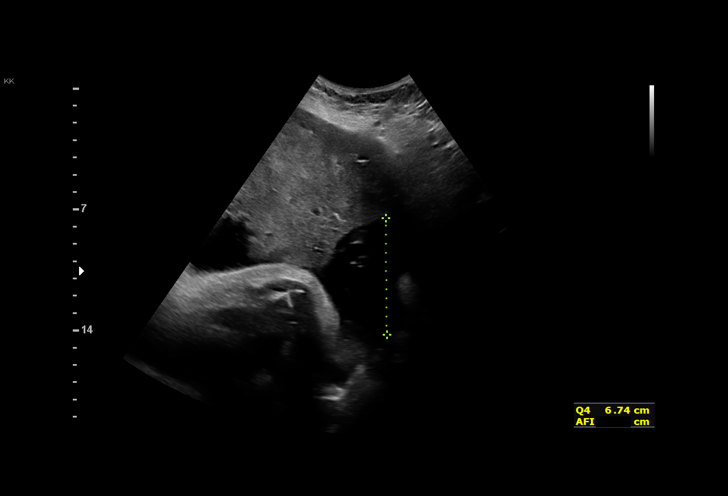
[im 20/39]
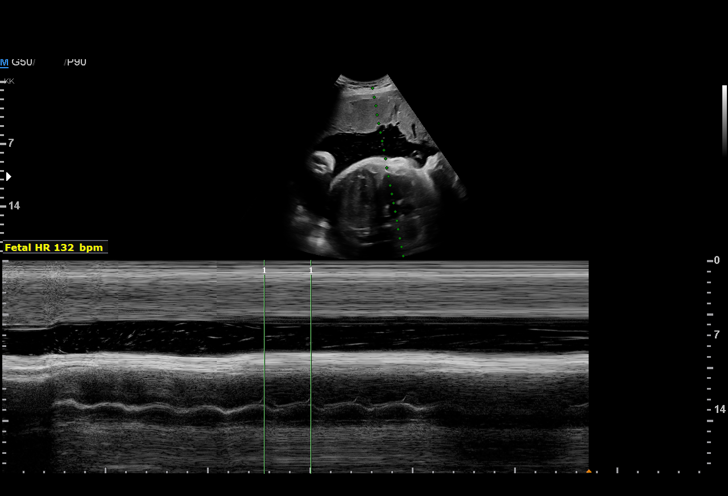
[im 23/39]
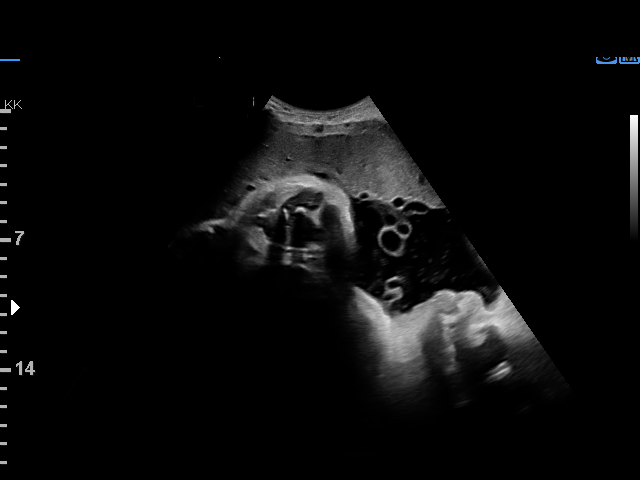
[im 26/39]
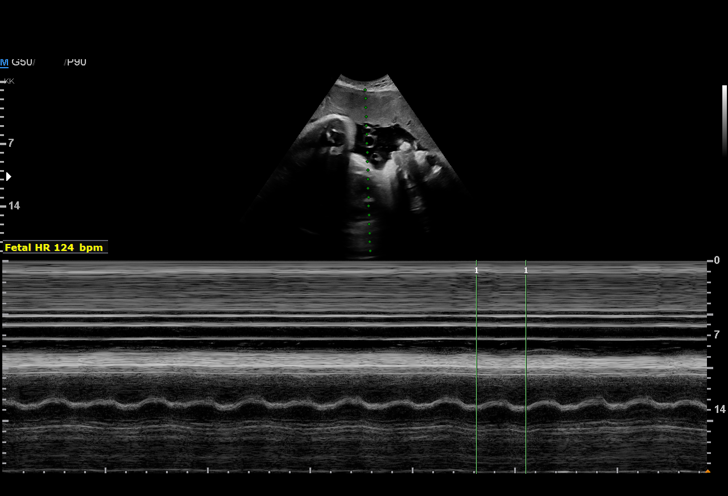
[im 29/39]
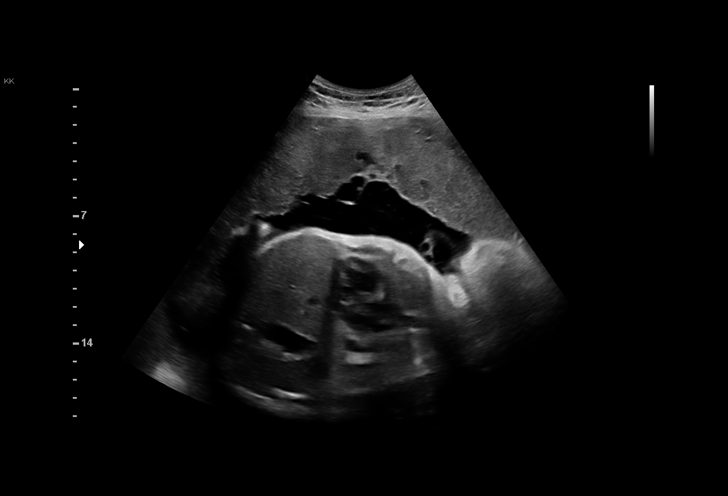
[im 30/39]
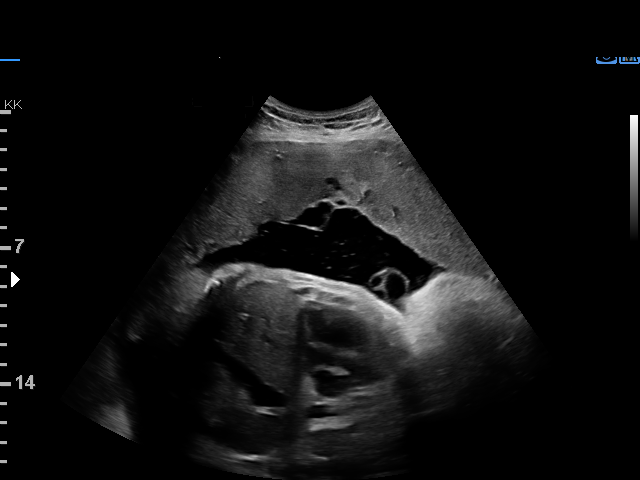
[im 33/39]
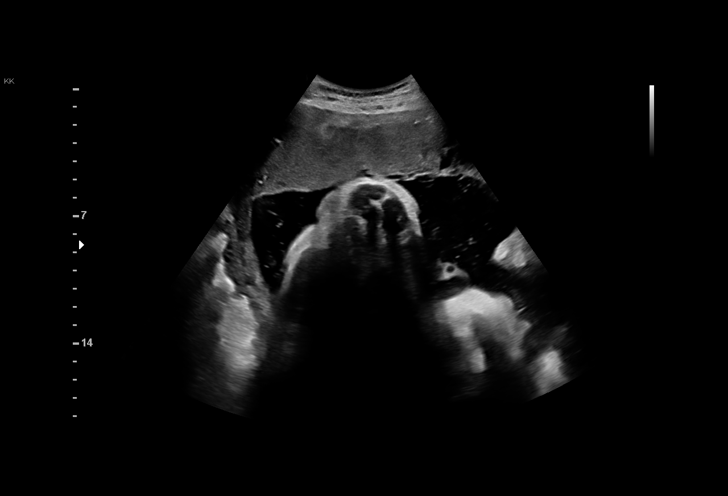
[im 36/39]
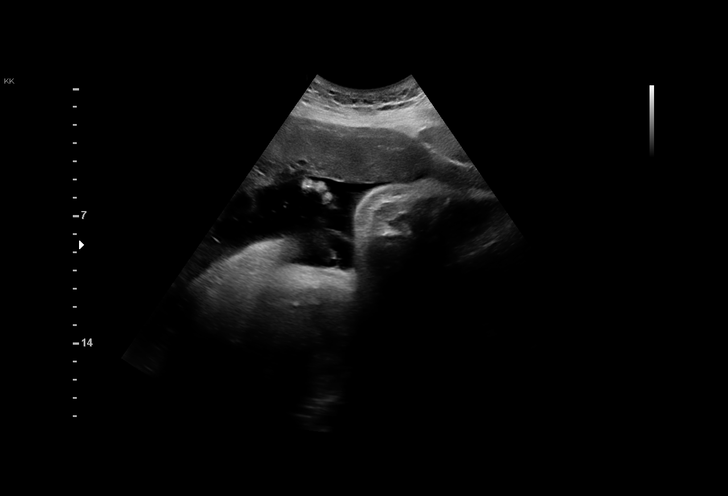
[im 39/39]
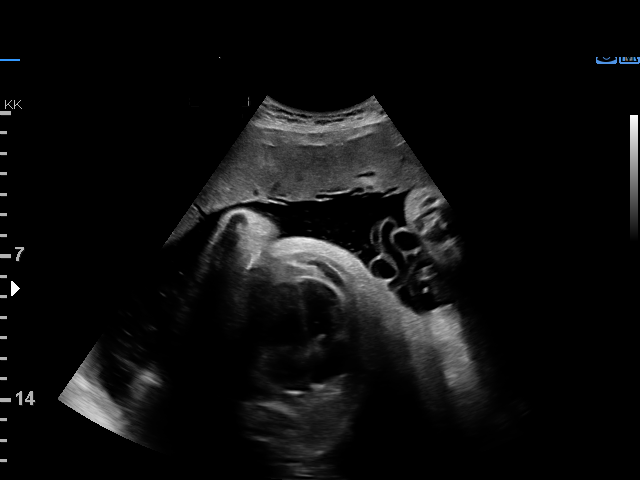

[16 of 28 positions shown; findings below may reference images not displayed]

----------------------------------------------------------------------

 ----------------------------------------------------------------------
Indications

  Decreased fetal movement
  38 weeks gestation of pregnancy
 ----------------------------------------------------------------------
Fetal Evaluation

 Num Of Fetuses:          1
 Fetal Heart Rate(bpm):   103
 Cardiac Activity:        Observed
 Presentation:            Cephalic
 Placenta:                Anterior

 Amniotic Fluid
 AFI FV:      Polyhydramnios

 AFI Sum(cm)     %Tile       Largest Pocket(cm)
 31.29           > 97

 RUQ(cm)       RLQ(cm)       LUQ(cm)        LLQ(cm)


 Comment:    Heart Rate ranges from 97 bpm to 132 bpm.
Biophysical Evaluation

 Amniotic F.V:   Polyhydramnios             F. Tone:         Observed
 F. Movement:    Observed                   Score:           [DATE]
 F. Breathing:   Observed
OB History

 Gravidity:    2         Term:   1
 Living:       1
Gestational Age

 LMP:           38w 0d        Date:  09/01/17                 EDD:   06/08/18
 Best:          38w 0d     Det. By:  LMP  (09/01/17)          EDD:   06/08/18
Impression

 Biophysical profile [DATE]
 Polyhydramnios
Recommendations

 Continue weekly testing
 Consider delivery by 39 weeks

## 2020-06-18 ENCOUNTER — Ambulatory Visit: Payer: BC Managed Care – PPO | Admitting: Internal Medicine

## 2020-09-26 IMAGING — MG DIGITAL DIAGNOSTIC BILAT W/ TOMO W/ CAD
6 of 10 series · 6 of 30 positions shown · non-contrast
Comparison: None

CLINICAL DATA: 38-year-old female presenting with shooting pain in
the left outer breast. Patient reports the breast was red and
swollen a couple of weeks ago but following antibiotics has
significantly improved.

EXAM:
DIGITAL DIAGNOSTIC BILATERAL MAMMOGRAM WITH CAD AND TOMO
ULTRASOUND LEFT BREAST

[L CC synth-2D]
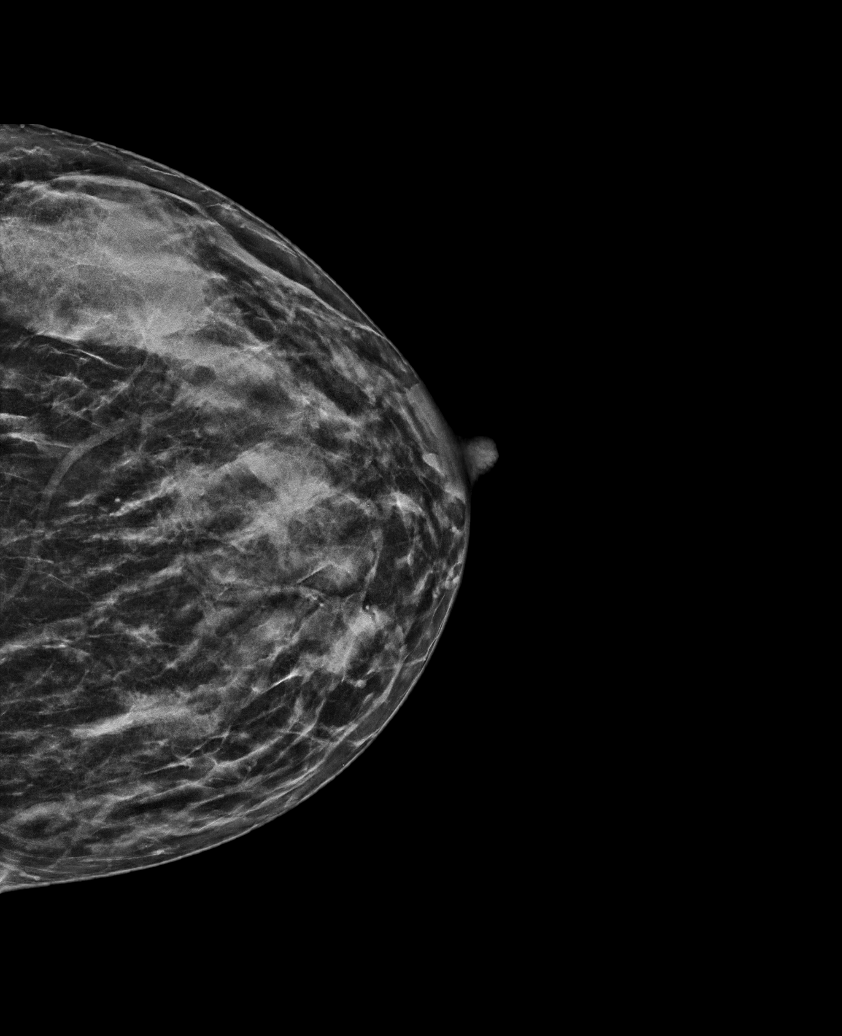

[R MLO synth-2D]
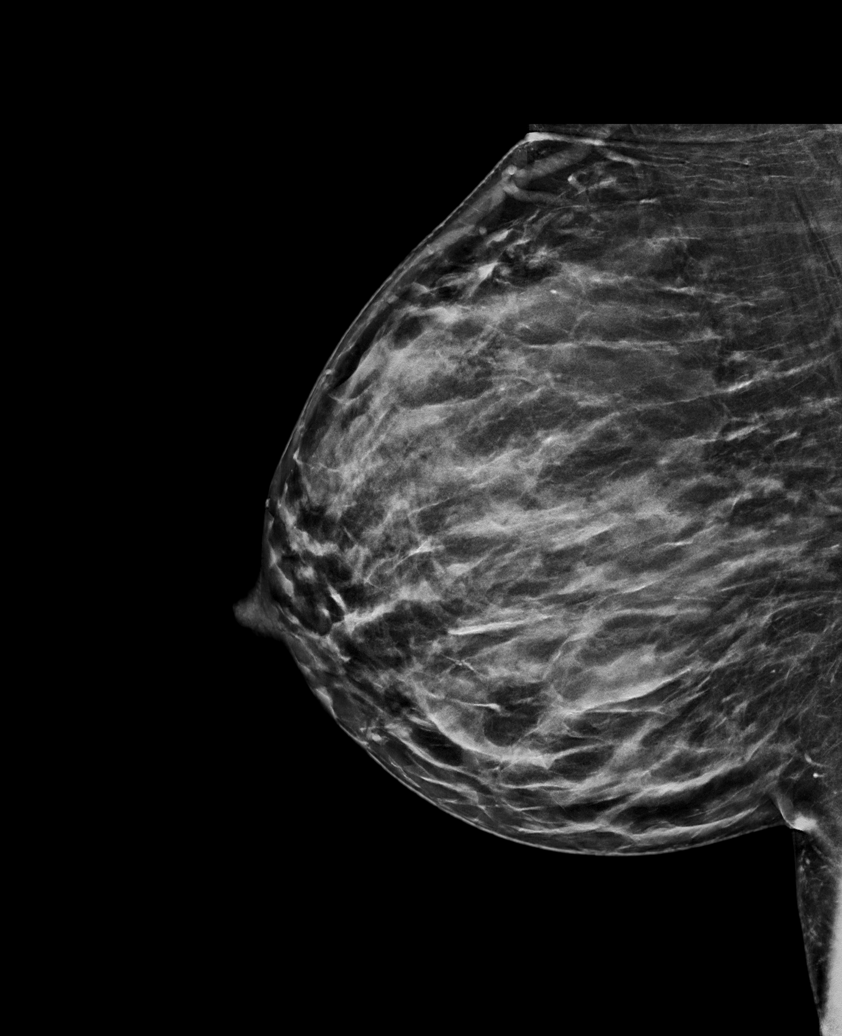

[L MLO synth-2D (1 of 2)]
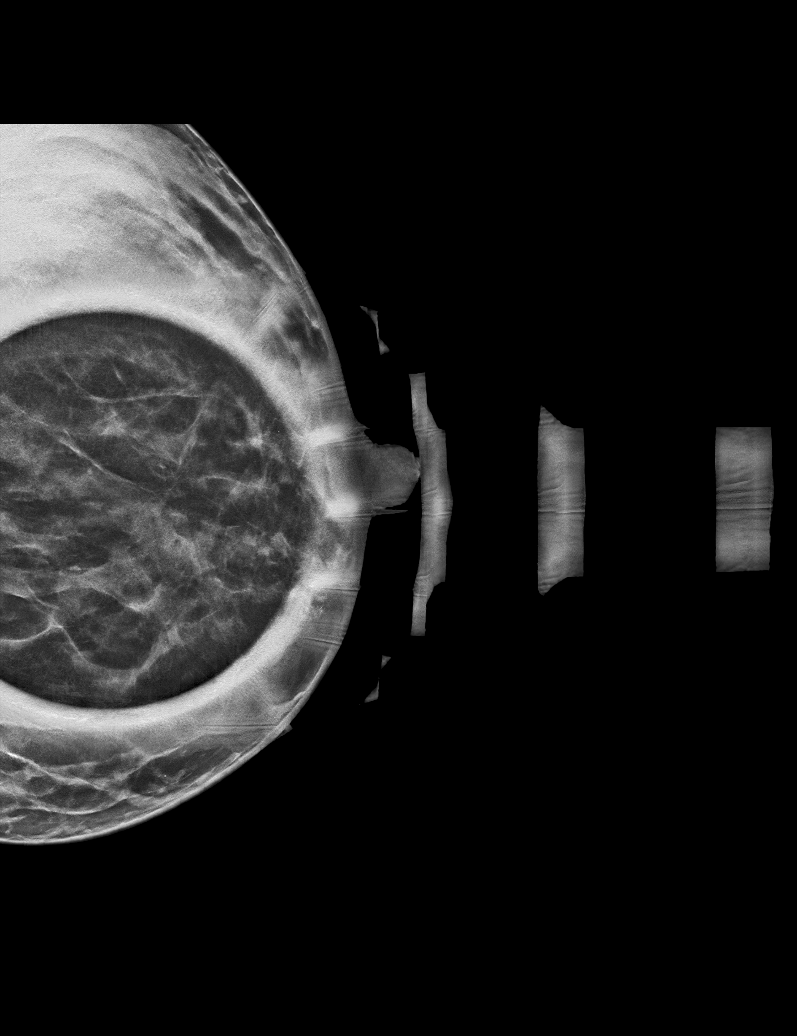

[R CC synth-2D]
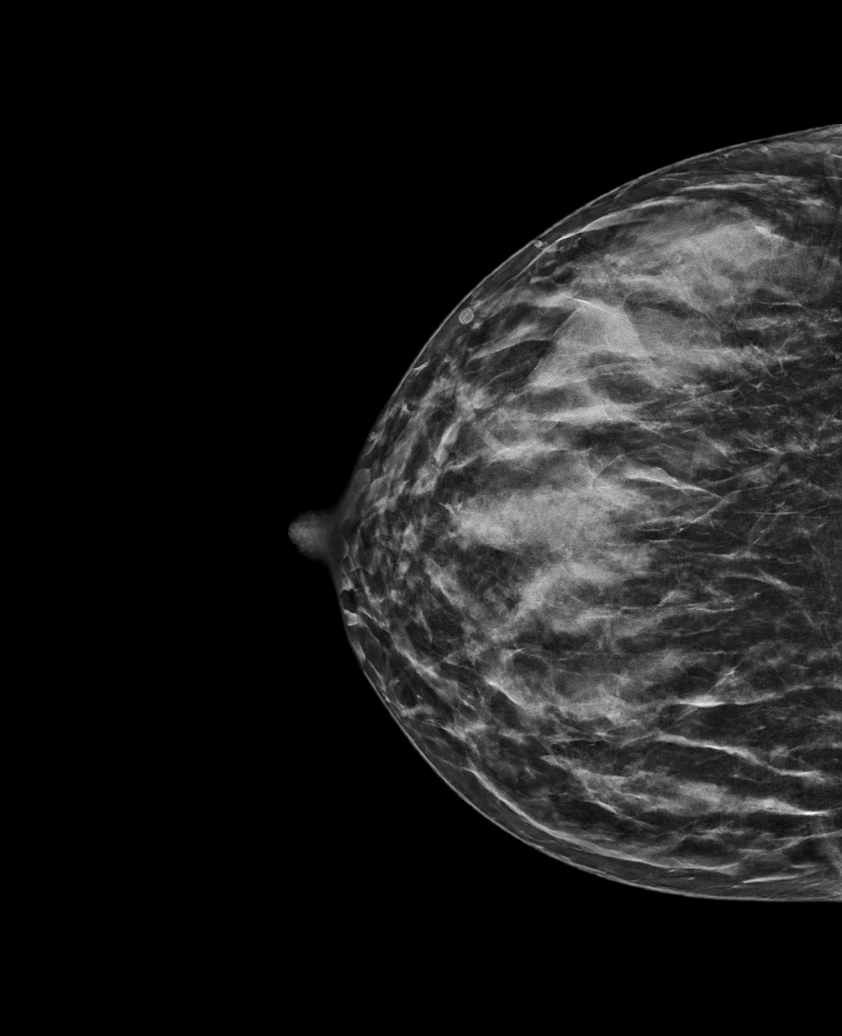

[L MLO synth-2D (2 of 2)]
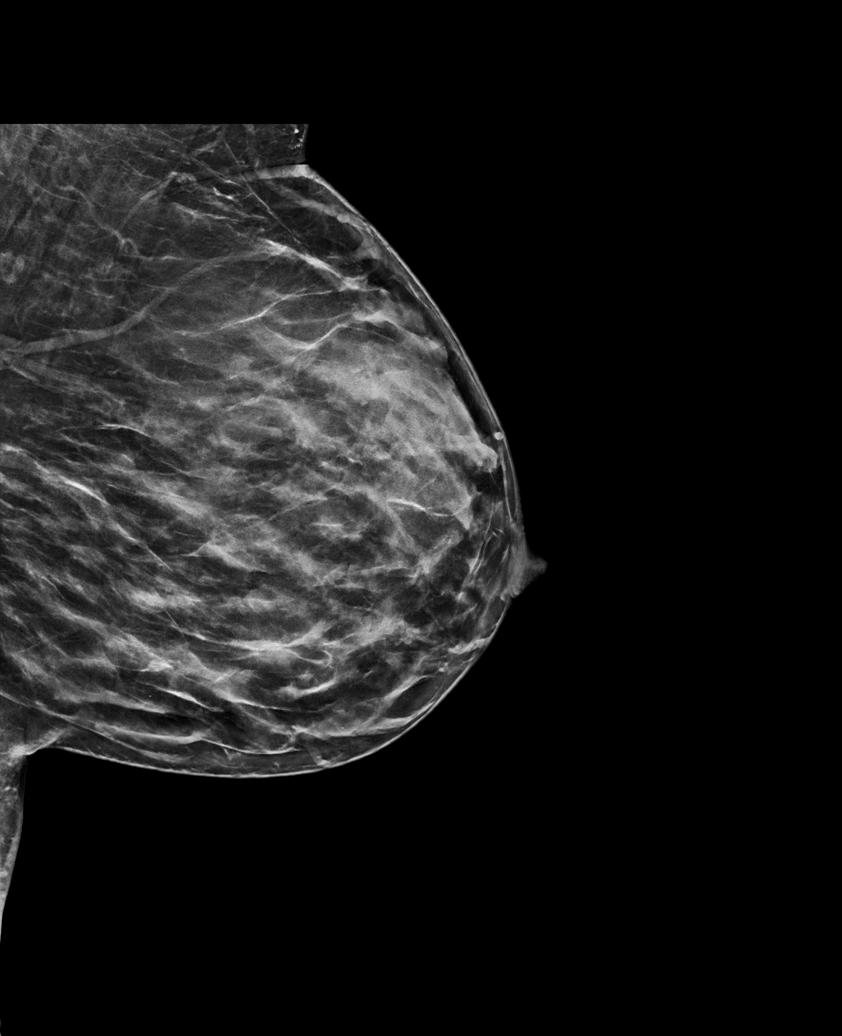

[R MLO tomo · tomo slice 34/67.0]
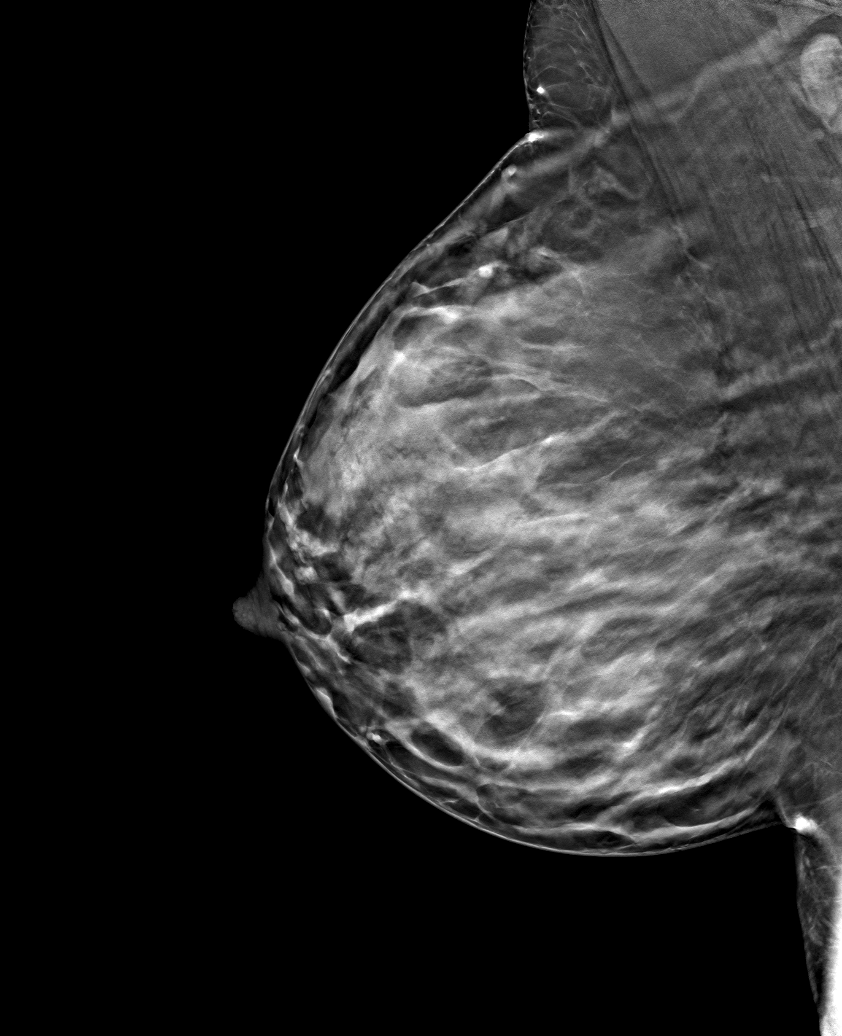

[6 of 30 positions shown; findings below may reference images not displayed]

ACR Breast Density Category d: The breast tissue is extremely dense,
which lowers the sensitivity of mammography.
FINDINGS: Mammogram:

Right breast: No suspicious mass, distortion, or microcalcifications
are identified to suggest presence of malignancy.

Left breast: No suspicious mass, distortion, or microcalcifications
are identified to suggest presence of malignancy. There is no
definite abnormality at the site of patient's pain in the outer left
breast however there is dense tissue throughout this region

Mammographic images were processed with CAD.

Ultrasound:

Targeted ultrasound is performed throughout the outer and
retroareolar left breast. There is no suspicious cystic or solid
mass. No focal fluid collection. There are a few mildly dilated
ducts in the retroareolar region without internal debris or
intraductal mass.
IMPRESSION: No mammographic evidence of malignancy in the bilateral breasts.
There is no abnormality at the site of patient's pain in the outer
left breast, specifically no mass or focal fluid collection.

RECOMMENDATION:
1. Clinical follow-up as needed for the left breast pain. Patient
reports significant improvement in symptoms of erythema and swelling
as well as pain following course of antibiotics.

2. Return for routine annual screening mammography beginning at age
40.

I have discussed the findings and recommendations with the patient.
If applicable, a reminder letter will be sent to the patient
regarding the next appointment.

BI-RADS CATEGORY  1: Negative.

## 2020-09-26 IMAGING — US US BREAST*L* LIMITED INC AXILLA
1 series · 8 of 8 positions shown · non-contrast
Comparison: None

CLINICAL DATA: 38-year-old female presenting with shooting pain in
the left outer breast. Patient reports the breast was red and
swollen a couple of weeks ago but following antibiotics has
significantly improved.

EXAM:
DIGITAL DIAGNOSTIC BILATERAL MAMMOGRAM WITH CAD AND TOMO
ULTRASOUND LEFT BREAST

[Series 1: us breast*left* limited inc axilla · 0.06mm/px · 8 of 8 slices shown]
[im 1/8]
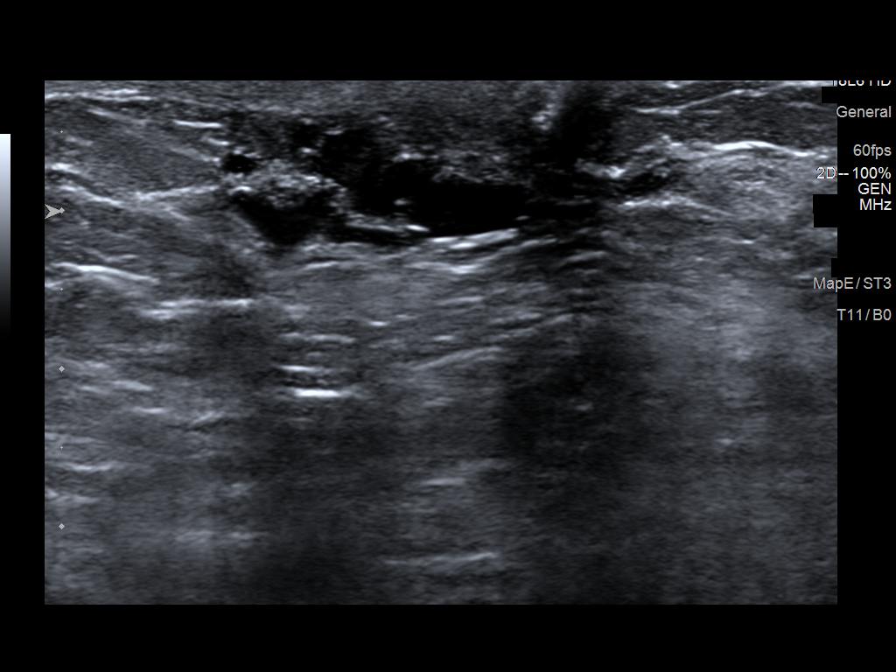
[im 2/8]
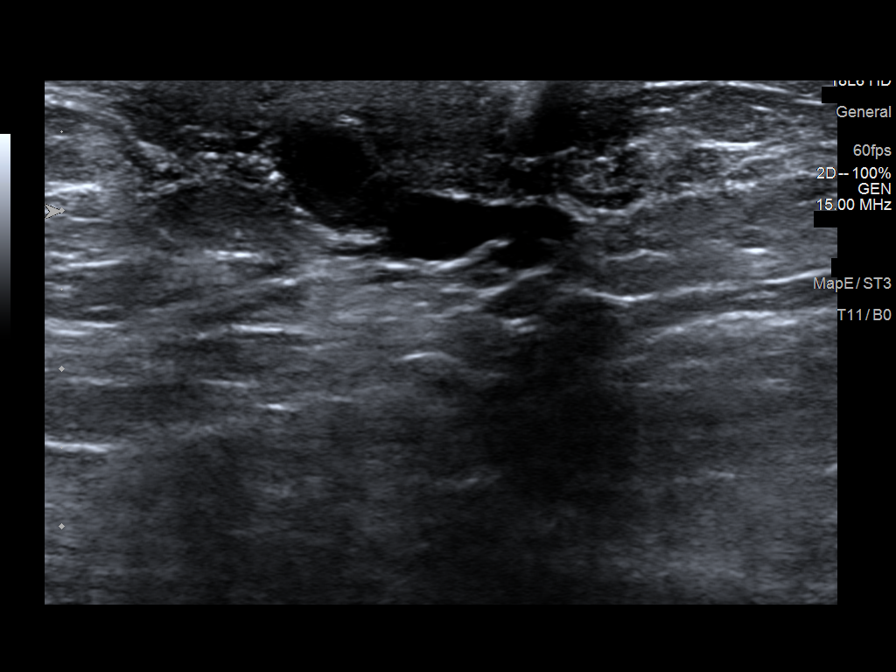
[im 3/8]
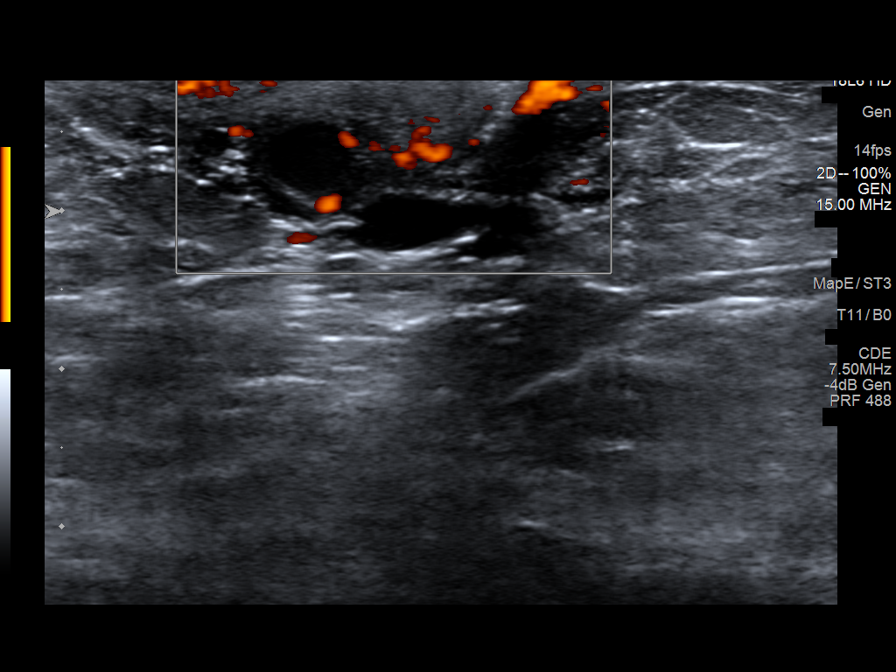
[im 4/8]
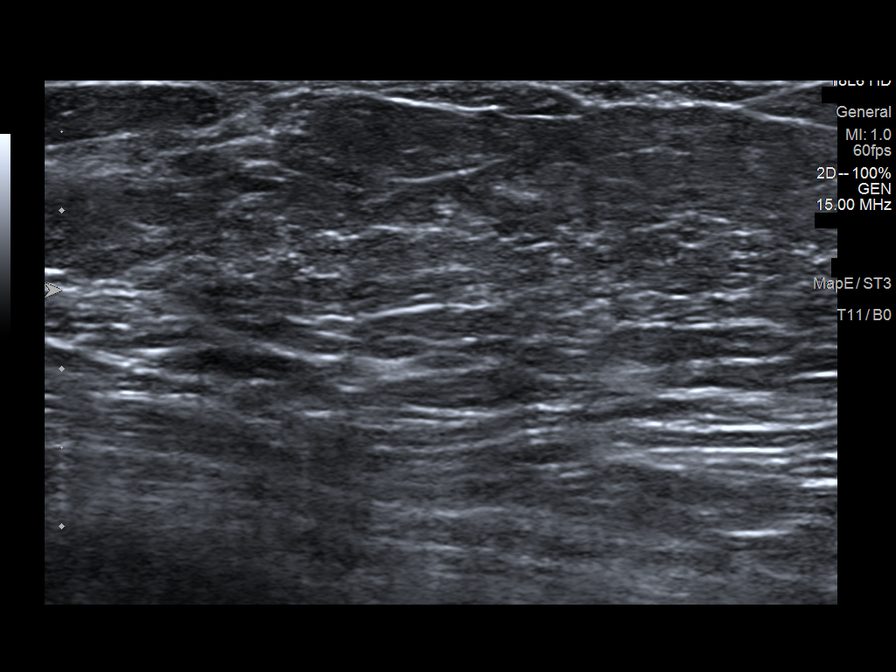
[im 5/8]
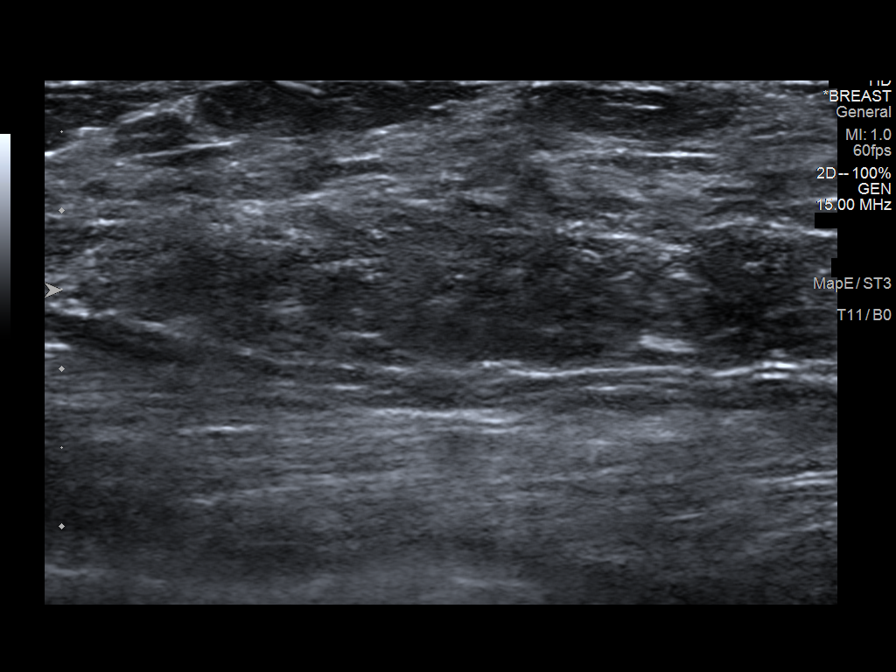
[im 6/8]
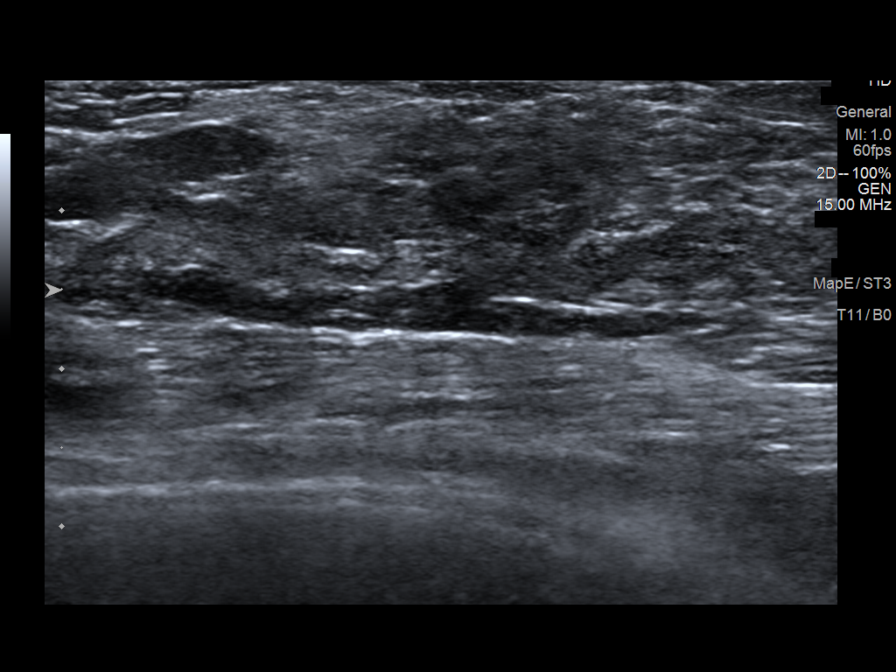
[im 7/8]
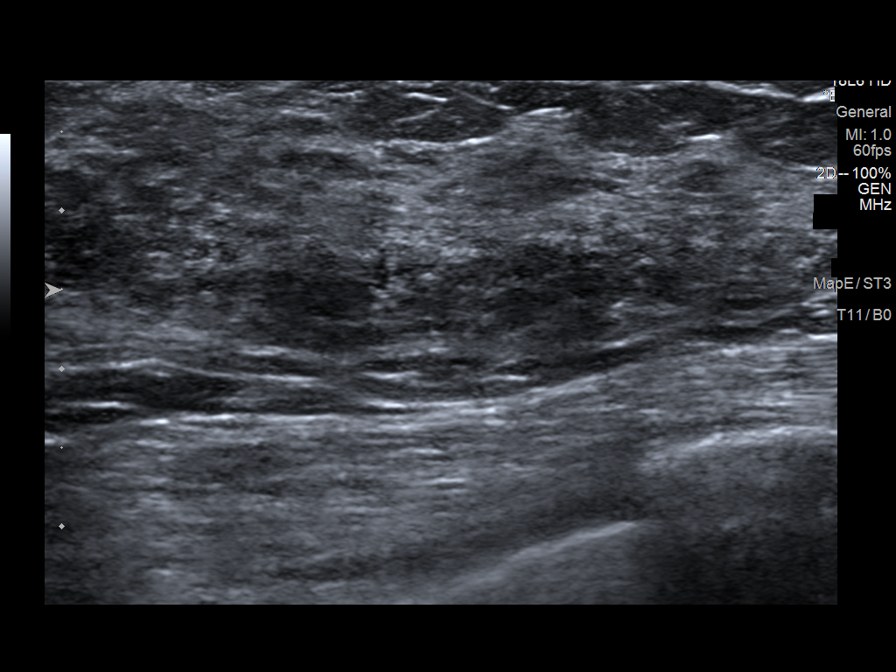
[im 8/8]
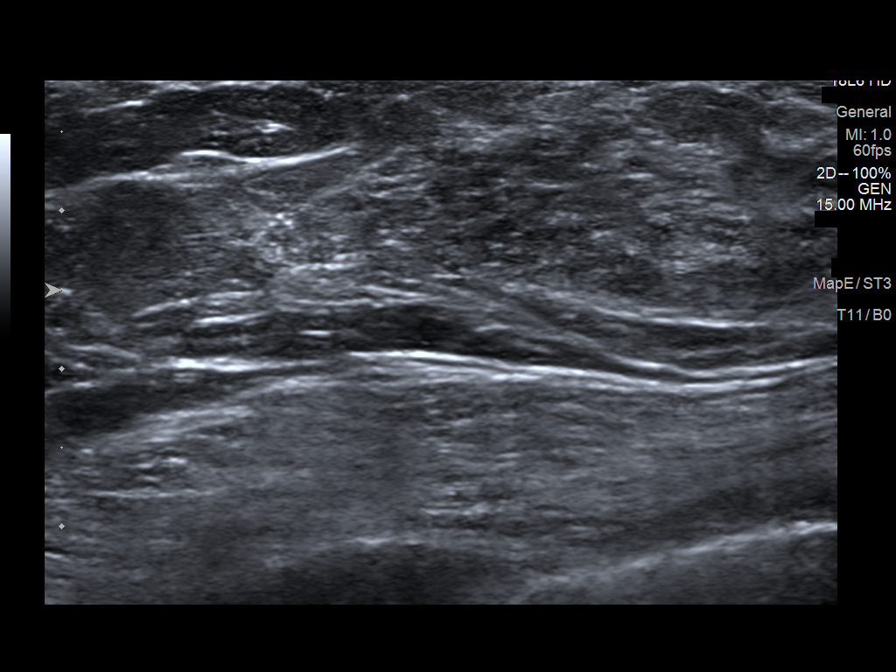

[8 of 8 positions shown; findings below may reference images not displayed]

ACR Breast Density Category d: The breast tissue is extremely dense,
which lowers the sensitivity of mammography.
FINDINGS: Mammogram:

Right breast: No suspicious mass, distortion, or microcalcifications
are identified to suggest presence of malignancy.

Left breast: No suspicious mass, distortion, or microcalcifications
are identified to suggest presence of malignancy. There is no
definite abnormality at the site of patient's pain in the outer left
breast however there is dense tissue throughout this region

Mammographic images were processed with CAD.

Ultrasound:

Targeted ultrasound is performed throughout the outer and
retroareolar left breast. There is no suspicious cystic or solid
mass. No focal fluid collection. There are a few mildly dilated
ducts in the retroareolar region without internal debris or
intraductal mass.
IMPRESSION: No mammographic evidence of malignancy in the bilateral breasts.
There is no abnormality at the site of patient's pain in the outer
left breast, specifically no mass or focal fluid collection.

RECOMMENDATION:
1. Clinical follow-up as needed for the left breast pain. Patient
reports significant improvement in symptoms of erythema and swelling
as well as pain following course of antibiotics.

2. Return for routine annual screening mammography beginning at age
40.

I have discussed the findings and recommendations with the patient.
If applicable, a reminder letter will be sent to the patient
regarding the next appointment.

BI-RADS CATEGORY  1: Negative.

## 2020-12-14 ENCOUNTER — Other Ambulatory Visit: Payer: Self-pay | Admitting: Obstetrics and Gynecology

## 2020-12-14 DIAGNOSIS — N6320 Unspecified lump in the left breast, unspecified quadrant: Secondary | ICD-10-CM

## 2021-01-02 ENCOUNTER — Other Ambulatory Visit: Payer: Self-pay

## 2021-01-02 ENCOUNTER — Ambulatory Visit: Payer: BC Managed Care – PPO

## 2021-01-02 ENCOUNTER — Ambulatory Visit
Admission: RE | Admit: 2021-01-02 | Discharge: 2021-01-02 | Disposition: A | Discharge status: Home / Self Care | Payer: BC Managed Care – PPO | Source: Ambulatory Visit | Attending: Obstetrics and Gynecology | Admitting: Obstetrics and Gynecology

## 2021-01-02 DIAGNOSIS — N6320 Unspecified lump in the left breast, unspecified quadrant: Secondary | ICD-10-CM
